# Patient Record
Sex: Male | Born: 1945 | Race: White | Hispanic: No | Marital: Married | Smoking: Never smoker
Health system: Southern US, Community
[De-identification: ages and names within clinical notes are randomized; demographics above are authoritative.]

## PROBLEM LIST (undated history)

## (undated) DIAGNOSIS — E7800 Pure hypercholesterolemia, unspecified: Secondary | ICD-10-CM

## (undated) DIAGNOSIS — F0390 Unspecified dementia without behavioral disturbance: Secondary | ICD-10-CM

## (undated) DIAGNOSIS — K5792 Diverticulitis of intestine, part unspecified, without perforation or abscess without bleeding: Secondary | ICD-10-CM

## (undated) DIAGNOSIS — E079 Disorder of thyroid, unspecified: Secondary | ICD-10-CM

## (undated) DIAGNOSIS — M810 Age-related osteoporosis without current pathological fracture: Secondary | ICD-10-CM

## (undated) DIAGNOSIS — I10 Essential (primary) hypertension: Secondary | ICD-10-CM

## (undated) HISTORY — PX: TRANSURETHRAL RESECTION OF PROSTATE: SHX73

## (undated) HISTORY — PX: NOSE SURGERY: SHX723

---

## 2011-12-22 ENCOUNTER — Emergency Department (HOSPITAL_BASED_OUTPATIENT_CLINIC_OR_DEPARTMENT_OTHER)
Admission: EM | Admit: 2011-12-22 | Discharge: 2011-12-22 | Disposition: A | Discharge status: Home / Self Care | Payer: Medicare Other | Attending: Emergency Medicine | Admitting: Emergency Medicine

## 2011-12-22 ENCOUNTER — Encounter (HOSPITAL_BASED_OUTPATIENT_CLINIC_OR_DEPARTMENT_OTHER): Payer: Self-pay | Admitting: *Deleted

## 2011-12-22 ENCOUNTER — Emergency Department (INDEPENDENT_AMBULATORY_CARE_PROVIDER_SITE_OTHER): Payer: Medicare Other

## 2011-12-22 DIAGNOSIS — R509 Fever, unspecified: Secondary | ICD-10-CM | POA: Insufficient documentation

## 2011-12-22 DIAGNOSIS — R05 Cough: Secondary | ICD-10-CM

## 2011-12-22 DIAGNOSIS — I10 Essential (primary) hypertension: Secondary | ICD-10-CM | POA: Insufficient documentation

## 2011-12-22 DIAGNOSIS — R059 Cough, unspecified: Secondary | ICD-10-CM

## 2011-12-22 DIAGNOSIS — Z79899 Other long term (current) drug therapy: Secondary | ICD-10-CM | POA: Insufficient documentation

## 2011-12-22 DIAGNOSIS — R197 Diarrhea, unspecified: Secondary | ICD-10-CM

## 2011-12-22 DIAGNOSIS — Z7982 Long term (current) use of aspirin: Secondary | ICD-10-CM | POA: Insufficient documentation

## 2011-12-22 DIAGNOSIS — R630 Anorexia: Secondary | ICD-10-CM | POA: Insufficient documentation

## 2011-12-22 DIAGNOSIS — K5792 Diverticulitis of intestine, part unspecified, without perforation or abscess without bleeding: Secondary | ICD-10-CM

## 2011-12-22 DIAGNOSIS — R1032 Left lower quadrant pain: Secondary | ICD-10-CM | POA: Insufficient documentation

## 2011-12-22 DIAGNOSIS — J069 Acute upper respiratory infection, unspecified: Secondary | ICD-10-CM

## 2011-12-22 DIAGNOSIS — K5732 Diverticulitis of large intestine without perforation or abscess without bleeding: Secondary | ICD-10-CM | POA: Insufficient documentation

## 2011-12-22 DIAGNOSIS — R1110 Vomiting, unspecified: Secondary | ICD-10-CM

## 2011-12-22 DIAGNOSIS — E7800 Pure hypercholesterolemia, unspecified: Secondary | ICD-10-CM | POA: Insufficient documentation

## 2011-12-22 DIAGNOSIS — R112 Nausea with vomiting, unspecified: Secondary | ICD-10-CM | POA: Insufficient documentation

## 2011-12-22 HISTORY — DX: Disorder of thyroid, unspecified: E07.9

## 2011-12-22 HISTORY — DX: Pure hypercholesterolemia, unspecified: E78.00

## 2011-12-22 HISTORY — DX: Essential (primary) hypertension: I10

## 2011-12-22 LAB — COMPREHENSIVE METABOLIC PANEL
ALT: 27 U/L (ref 0–53)
AST: 34 U/L (ref 0–37)
Albumin: 4.1 g/dL (ref 3.5–5.2)
Alkaline Phosphatase: 43 U/L (ref 39–117)
BUN: 10 mg/dL (ref 6–23)
CO2: 23 mEq/L (ref 19–32)
Calcium: 9.2 mg/dL (ref 8.4–10.5)
Chloride: 101 mEq/L (ref 96–112)
Creatinine, Ser: 1 mg/dL (ref 0.50–1.35)
GFR calc Af Amer: 89 mL/min — ABNORMAL LOW (ref >90)
GFR calc non Af Amer: 77 mL/min — ABNORMAL LOW (ref >90)
Glucose, Bld: 100 mg/dL — ABNORMAL HIGH (ref 70–99)
Potassium: 3.3 mEq/L — ABNORMAL LOW (ref 3.5–5.1)
Sodium: 138 mEq/L (ref 135–145)
Total Bilirubin: 0.3 mg/dL (ref 0.3–1.2)
Total Protein: 7.6 g/dL (ref 6.0–8.3)

## 2011-12-22 LAB — DIFFERENTIAL
Basophils Absolute: 0 10*3/uL (ref 0.0–0.1)
Basophils Relative: 0 % (ref 0–1)
Eosinophils Absolute: 0 10*3/uL (ref 0.0–0.7)
Eosinophils Relative: 0 % (ref 0–5)
Lymphocytes Relative: 7 % — ABNORMAL LOW (ref 12–46)
Lymphs Abs: 0.9 10*3/uL (ref 0.7–4.0)
Monocytes Absolute: 0.7 10*3/uL (ref 0.1–1.0)
Monocytes Relative: 6 % (ref 3–12)
Neutro Abs: 10.1 10*3/uL — ABNORMAL HIGH (ref 1.7–7.7)
Neutrophils Relative %: 86 % — ABNORMAL HIGH (ref 43–77)

## 2011-12-22 LAB — CBC
HCT: 42.3 % (ref 39.0–52.0)
Hemoglobin: 14.7 g/dL (ref 13.0–17.0)
MCH: 29.9 pg (ref 26.0–34.0)
MCHC: 34.8 g/dL (ref 30.0–36.0)
MCV: 86.2 fL (ref 78.0–100.0)
Platelets: 237 10*3/uL (ref 150–400)
RBC: 4.91 MIL/uL (ref 4.22–5.81)
RDW: 13.8 % (ref 11.5–15.5)
WBC: 11.7 10*3/uL — ABNORMAL HIGH (ref 4.0–10.5)

## 2011-12-22 LAB — LIPASE, BLOOD: Lipase: 23 U/L (ref 11–59)

## 2011-12-22 MED ORDER — ONDANSETRON HCL 4 MG/2ML IJ SOLN
4.0000 mg | Freq: Once | INTRAMUSCULAR | Status: AC
  Administered 2011-12-22: 4 mg via INTRAVENOUS
  Filled 2011-12-22: qty 2

## 2011-12-22 MED ORDER — ONDANSETRON HCL 8 MG PO TABS: 8.0000 mg | ORAL_TABLET | Freq: Three times a day (TID) | ORAL | Status: AC | PRN

## 2011-12-22 MED ORDER — SODIUM CHLORIDE 0.9 % IV SOLN: INTRAVENOUS | Status: DC

## 2011-12-22 MED ORDER — HYDROCODONE-ACETAMINOPHEN 5-325 MG PO TABS: 2.0000 | ORAL_TABLET | ORAL | Status: AC | PRN

## 2011-12-22 MED ORDER — METRONIDAZOLE 500 MG PO TABS
500.0000 mg | ORAL_TABLET | Freq: Once | ORAL | Status: AC
  Administered 2011-12-22: 500 mg via ORAL
  Filled 2011-12-22: qty 1

## 2011-12-22 MED ORDER — CIPROFLOXACIN HCL 500 MG PO TABS: 500.0000 mg | ORAL_TABLET | Freq: Two times a day (BID) | ORAL | Status: AC

## 2011-12-22 MED ORDER — FAMOTIDINE IN NACL 20-0.9 MG/50ML-% IV SOLN
20.0000 mg | Freq: Once | INTRAVENOUS | Status: AC
  Administered 2011-12-22: 20 mg via INTRAVENOUS
  Filled 2011-12-22: qty 50

## 2011-12-22 MED ORDER — CIPROFLOXACIN HCL 500 MG PO TABS
500.0000 mg | ORAL_TABLET | Freq: Once | ORAL | Status: AC
  Administered 2011-12-22: 500 mg via ORAL
  Filled 2011-12-22: qty 1

## 2011-12-22 MED ORDER — HYDROMORPHONE HCL PF 1 MG/ML IJ SOLN
1.0000 mg | Freq: Once | INTRAMUSCULAR | Status: AC
  Administered 2011-12-22: 1 mg via INTRAVENOUS
  Filled 2011-12-22: qty 1

## 2011-12-22 MED ORDER — METRONIDAZOLE 500 MG PO TABS: 500.0000 mg | ORAL_TABLET | Freq: Three times a day (TID) | ORAL | Status: AC

## 2011-12-22 MED ORDER — SODIUM CHLORIDE 0.9 % IV BOLUS (SEPSIS)
1000.0000 mL | Freq: Once | INTRAVENOUS | Status: AC
  Administered 2011-12-22: 1000 mL via INTRAVENOUS

## 2011-12-22 NOTE — ED Notes (Signed)
Pt states he has had diarrhea x 3 days. Cold s/s last week. Wed/Thur vomiting. Diarrhea off and on since.

## 2011-12-22 NOTE — ED Provider Notes (Signed)
History   This chart was scribed for Felisa Bonier, MD by Melba Coon. The patient was seen in room MH05/MH05 and the patient's care was started at 6:47PM.    CSN: [id number]  Arrival date & time 12/22/11  1732   First MD Initiated Contact with Patient 12/22/11 1823      Chief Complaint  Patient presents with  . Diarrhea    (Consider location/radiation/quality/duration/timing/severity/associated sxs/prior treatment) HPI Steve [name] is a 66 y.o. male who presents to the Emergency Department complaining of intermittent, moderate to severe diarrhea wth an onset 3 days ago. Pt also had cold-like symptoms such as fever, productive cough, and chills a week ago. Nausea is present and emesis started 4 days ago and ended 3 days ago. Pt has had more than 5 bowel movements/day. Stools were loose and watery on 3 days ago. Pt only started to have abdominal cramps 2 days ago. Pt has taken Pepto-Bismol and imodium which has slowed down diarrhea. Fluid intake has been good but aggravates his cramps. No abx have been taken. Decreased appetite present. No HA, CP, or extremity pain. Pt has a Hx of HTN, hypercholesteremia, and thyroid disease. No Hx of CHF. Allergic to penicillins. No other pertinent medical problems.  Past Medical History  Diagnosis Date  . Hypertension   . Hypercholesteremia   . Thyroid disease     Past Surgical History  Procedure Date  . Nose surgery     History reviewed. No pertinent family history.  History  Substance Use Topics  . Smoking status: Never Smoker   . Smokeless tobacco: Not on file  . Alcohol Use: No      Review of Systems 10 Systems reviewed and are negative for acute change except as noted in the HPI.  Allergies  Penicillins  Home Medications   Current Outpatient Rx  Name Route Sig Dispense Refill  . ASPIRIN 325 MG PO TBEC Oral Take 325 mg by mouth daily.    Marland Kitchen BISMUTH SUBSALICYLATE 262 MG PO CHEW Oral Chew 524 mg by mouth as needed. For  diarrhea    . VITAMIN D 1000 UNITS PO TABS Oral Take 1,000 Units by mouth daily.    . CYANOCOBALAMIN 100 MCG PO TABS Oral Take 100 mcg by mouth daily.    Marland Kitchen ESOMEPRAZOLE MAGNESIUM 40 MG PO CPDR Oral Take 40 mg by mouth daily before breakfast.    . FENOFIBRATE PO Oral Take 1 tablet by mouth daily.    . OMEGA-3 FATTY ACIDS 1000 MG PO CAPS Oral Take 1-2 g by mouth 2 (two) times daily. Take 1 capsule in the morning and 2 capsules in the evening    . LEVOTHYROXINE SODIUM 100 MCG PO TABS Oral Take 100 mcg by mouth daily.    Marland Kitchen LISINOPRIL 10 MG PO TABS Oral Take 10 mg by mouth daily.    Marland Kitchen LOPERAMIDE HCL 2 MG PO CAPS Oral Take 2 mg by mouth 4 (four) times daily as needed. For diarrhea    . MONTELUKAST SODIUM 10 MG PO TABS Oral Take 10 mg by mouth at bedtime.    Marland Kitchen NIACIN ER (ANTIHYPERLIPIDEMIC) 1000 MG PO TBCR Oral Take 2,000 mg by mouth at bedtime.    Marland Kitchen SIMVASTATIN 40 MG PO TABS Oral Take 40 mg by mouth every evening.    Marland Kitchen TAMSULOSIN HCL 0.4 MG PO CAPS Oral Take 0.4 mg by mouth daily.    Marland Kitchen VITAMIN C 500 MG PO TABS Oral Take 500 mg by mouth daily.  BP 113/99  Pulse 78  Temp(Src) 97.7 F (36.5 C) (Oral)  Resp 20  Ht 6\' 3"  (1.905 m)  Wt 282 lb (127.914 kg)  BMI 35.25 kg/m2  SpO2 97%  Physical Exam  Nursing note and vitals reviewed. Constitutional: He appears well-developed and well-nourished.       Awake, alert, nontoxic appearance.  HENT:  Head: Normocephalic and atraumatic.  Mouth/Throat: Oropharynx is clear and moist. No oropharyngeal exudate.  Eyes: Conjunctivae and EOM are normal. Pupils are equal, round, and reactive to light. Right eye exhibits no discharge. Left eye exhibits no discharge. No scleral icterus.  Neck: Normal range of motion. Neck supple.  Cardiovascular: Normal rate, regular rhythm and normal heart sounds.  Exam reveals no gallop and no friction rub.   No murmur heard. Pulmonary/Chest: Effort normal and breath sounds normal. No respiratory distress. He has no  wheezes. He has no rales. He exhibits no tenderness.  Abdominal: Soft. Bowel sounds are normal. He exhibits no distension. There is tenderness (Mild LLQ tenderness). There is no rebound and no guarding.  Musculoskeletal: He exhibits no tenderness.       Baseline ROM, no obvious new focal weakness.  Neurological:       Mental status and motor strength appears baseline for patient and situation.  Skin: Skin is warm and dry. No rash noted.       Nml perfusion.  Psychiatric: He has a normal mood and affect. His behavior is normal.    ED Course  Procedures (including critical care time)  DIAGNOSTIC STUDIES: Oxygen Saturation is 97% on room air, normal by my interpretation.    COORDINATION OF CARE:  6:58PM - EDMD will order CXR to exclude pneumonia and IV fluids.  Results for orders placed during the hospital encounter of 12/22/11  CBC      Component Value Range   WBC 11.7 (*) 4.0 - 10.5 (K/uL)   RBC 4.91  4.22 - 5.81 (MIL/uL)   Hemoglobin 14.7  13.0 - 17.0 (g/dL)   HCT 16.1  09.6 - 04.5 (%)   MCV 86.2  78.0 - 100.0 (fL)   MCH 29.9  26.0 - 34.0 (pg)   MCHC 34.8  30.0 - 36.0 (g/dL)   RDW 40.9  81.1 - 91.4 (%)   Platelets 237  150 - 400 (K/uL)  DIFFERENTIAL      Component Value Range   Neutrophils Relative 86 (*) 43 - 77 (%)   Neutro Abs 10.1 (*) 1.7 - 7.7 (K/uL)   Lymphocytes Relative 7 (*) 12 - 46 (%)   Lymphs Abs 0.9  0.7 - 4.0 (K/uL)   Monocytes Relative 6  3 - 12 (%)   Monocytes Absolute 0.7  0.1 - 1.0 (K/uL)   Eosinophils Relative 0  0 - 5 (%)   Eosinophils Absolute 0.0  0.0 - 0.7 (K/uL)   Basophils Relative 0  0 - 1 (%)   Basophils Absolute 0.0  0.0 - 0.1 (K/uL)  COMPREHENSIVE METABOLIC PANEL      Component Value Range   Sodium 138  135 - 145 (mEq/L)   Potassium 3.3 (*) 3.5 - 5.1 (mEq/L)   Chloride 101  96 - 112 (mEq/L)   CO2 23  19 - 32 (mEq/L)   Glucose, Bld 100 (*) 70 - 99 (mg/dL)   BUN 10  6 - 23 (mg/dL)   Creatinine, Ser 7.82  0.50 - 1.35 (mg/dL)   Calcium  9.2  8.4 - 10.5 (mg/dL)   Total Protein 7.6  6.0 - 8.3 (g/dL)   Albumin 4.1  3.5 - 5.2 (g/dL)   AST 34  0 - 37 (U/L)   ALT 27  0 - 53 (U/L)   Alkaline Phosphatase 43  39 - 117 (U/L)   Total Bilirubin 0.3  0.3 - 1.2 (mg/dL)   GFR calc non Af Amer 77 (*) >90 (mL/min)   GFR calc Af Amer 89 (*) >90 (mL/min)  LIPASE, BLOOD      Component Value Range   Lipase 23  11 - 59 (U/L)     Dg Chest 2 View  12/22/2011  *RADIOLOGY REPORT*  Clinical Data: Cough with nausea, vomiting and diarrhea.  CHEST - 2 VIEW  Comparison: None.  Findings: The heart size is at the upper limits of normal.  Density projecting over the lower thoracic spine on the lateral view most likely represents tortuosity of the descending thoracic aorta.  The lungs are clear.  There is no pleural effusion.  There is a lower thoracic compression deformity which is age indeterminate.  IMPRESSION:  1.  No acute cardiopulmonary process identified. 2.  Aortic tortuosity is felt to account for density over the lower thoracic spine on the lateral view. 3.  Age indeterminate lower thoracic compression deformity. Per CMS PQRS reporting requirements (PQRS Measure 24): Given the patient's age of greater than 50 and the fracture site (hip, distal radius, or spine), the patient should be tested for osteoporosis using DXA, and the appropriate treatment considered based on the DXA results.  Original Report Authenticated By: Gerrianne Scale, M.D.   Chest x-ray reviewed, no apparent pneumonia.  No diagnosis found.    MDM   Differential Diagnosis: viral upper respiratory infection vs pneumonia vs gastroenteritis with persistent diarrhea vs diverticulitis. EDMD does not suspect pericolonic abscess based on PE and does not feel CT scan is necessary to further evaluate pt. EDMD will treat for diverticulitis and advise f/u with PCP.  I personally performed the services described in this documentation, which was scribed in my presence. The recorded  information has been reviewed and considered.      Felisa Bonier, MD 12/22/11 2035

## 2011-12-22 NOTE — ED Notes (Signed)
Patient transported to X-ray 

## 2014-03-30 ENCOUNTER — Emergency Department (HOSPITAL_BASED_OUTPATIENT_CLINIC_OR_DEPARTMENT_OTHER): Payer: Medicare Other

## 2014-03-30 ENCOUNTER — Encounter (HOSPITAL_BASED_OUTPATIENT_CLINIC_OR_DEPARTMENT_OTHER): Payer: Self-pay | Admitting: Emergency Medicine

## 2014-03-30 ENCOUNTER — Emergency Department (HOSPITAL_BASED_OUTPATIENT_CLINIC_OR_DEPARTMENT_OTHER)
Admission: EM | Admit: 2014-03-30 | Discharge: 2014-03-30 | Disposition: A | Discharge status: Home / Self Care | Payer: Medicare Other | Attending: Emergency Medicine | Admitting: Emergency Medicine

## 2014-03-30 DIAGNOSIS — Z79899 Other long term (current) drug therapy: Secondary | ICD-10-CM | POA: Insufficient documentation

## 2014-03-30 DIAGNOSIS — Z880 Allergy status to penicillin: Secondary | ICD-10-CM | POA: Insufficient documentation

## 2014-03-30 DIAGNOSIS — R0602 Shortness of breath: Secondary | ICD-10-CM | POA: Insufficient documentation

## 2014-03-30 DIAGNOSIS — E7800 Pure hypercholesterolemia, unspecified: Secondary | ICD-10-CM | POA: Insufficient documentation

## 2014-03-30 DIAGNOSIS — I10 Essential (primary) hypertension: Secondary | ICD-10-CM | POA: Insufficient documentation

## 2014-03-30 DIAGNOSIS — J069 Acute upper respiratory infection, unspecified: Secondary | ICD-10-CM

## 2014-03-30 DIAGNOSIS — Z7982 Long term (current) use of aspirin: Secondary | ICD-10-CM | POA: Insufficient documentation

## 2014-03-30 DIAGNOSIS — E079 Disorder of thyroid, unspecified: Secondary | ICD-10-CM | POA: Insufficient documentation

## 2014-03-30 LAB — BASIC METABOLIC PANEL
BUN: 15 mg/dL (ref 6–23)
CO2: 22 mEq/L (ref 19–32)
Calcium: 8.8 mg/dL (ref 8.4–10.5)
Chloride: 106 mEq/L (ref 96–112)
Creatinine, Ser: 1 mg/dL (ref 0.50–1.35)
GFR calc Af Amer: 88 mL/min — ABNORMAL LOW (ref >90)
GFR calc non Af Amer: 76 mL/min — ABNORMAL LOW (ref >90)
Glucose, Bld: 100 mg/dL — ABNORMAL HIGH (ref 70–99)
Potassium: 4 mEq/L (ref 3.7–5.3)
Sodium: 140 mEq/L (ref 137–147)

## 2014-03-30 LAB — CBC
HCT: 40.4 % (ref 39.0–52.0)
Hemoglobin: 14.1 g/dL (ref 13.0–17.0)
MCH: 31.3 pg (ref 26.0–34.0)
MCHC: 34.9 g/dL (ref 30.0–36.0)
MCV: 89.8 fL (ref 78.0–100.0)
Platelets: 206 10*3/uL (ref 150–400)
RBC: 4.5 MIL/uL (ref 4.22–5.81)
RDW: 14.2 % (ref 11.5–15.5)
WBC: 7.7 10*3/uL (ref 4.0–10.5)

## 2014-03-30 LAB — URINALYSIS, ROUTINE W REFLEX MICROSCOPIC
Bilirubin Urine: NEGATIVE
Glucose, UA: NEGATIVE mg/dL
Hgb urine dipstick: NEGATIVE
Ketones, ur: NEGATIVE mg/dL
Leukocytes, UA: NEGATIVE
Nitrite: NEGATIVE
Protein, ur: NEGATIVE mg/dL
Specific Gravity, Urine: 1.019 (ref 1.005–1.030)
Urobilinogen, UA: 0.2 mg/dL (ref 0.0–1.0)
pH: 6 (ref 5.0–8.0)

## 2014-03-30 LAB — PRO B NATRIURETIC PEPTIDE: Pro B Natriuretic peptide (BNP): 61.4 pg/mL (ref 0–125)

## 2014-03-30 LAB — TROPONIN I: Troponin I: <0.3 ng/mL (ref <0.30)

## 2014-03-30 MED ORDER — NITROGLYCERIN 0.4 MG SL SUBL
0.4000 mg | SUBLINGUAL_TABLET | SUBLINGUAL | Status: DC | PRN
  Administered 2014-03-30: 0.4 mg via SUBLINGUAL
  Filled 2014-03-30: qty 1

## 2014-03-30 MED ORDER — ASPIRIN 325 MG PO TABS
325.0000 mg | ORAL_TABLET | Freq: Once | ORAL | Status: AC
  Administered 2014-03-30: 325 mg via ORAL
  Filled 2014-03-30: qty 1

## 2014-03-30 NOTE — Discharge Instructions (Signed)

## 2014-03-30 NOTE — ED Provider Notes (Signed)
CSN: [id number]     Arrival date & time 03/30/14  1728 History   First MD Initiated Contact with Patient 03/30/14 1741     Chief Complaint  Patient presents with  . URI     (Consider location/radiation/quality/duration/timing/severity/associated sxs/prior Treatment) Patient is a 68 y.o. male presenting with URI and chest pain. The history is provided by the patient.  URI Presenting symptoms: no cough and no fever   Severity:  Mild Chest Pain Pain location:  Substernal area Pain quality: pressure   Pain radiates to:  Does not radiate Pain radiates to the back: no   Pain severity:  Mild Onset quality:  Gradual Duration:  2 days Timing:  Intermittent Progression:  Worsening Chronicity:  New Context: at rest   Relieved by:  Nothing Worsened by:  Nothing tried Associated symptoms: shortness of breath   Associated symptoms: no cough and no fever     Past Medical History  Diagnosis Date  . Hypertension   . Hypercholesteremia   . Thyroid disease    Past Surgical History  Procedure Laterality Date  . Nose surgery     History reviewed. No pertinent family history. History  Substance Use Topics  . Smoking status: Never Smoker   . Smokeless tobacco: Not on file  . Alcohol Use: No    Review of Systems  Constitutional: Negative for fever.  Respiratory: Positive for shortness of breath. Negative for cough.   Cardiovascular: Positive for chest pain (chest heaviness).  All other systems reviewed and are negative.     Allergies  Penicillins  Home Medications   Prior to Admission medications   Medication Sig Start Date End Date Taking? Authorizing Provider  aspirin 325 MG EC tablet Take 325 mg by mouth daily.    Historical Provider, MD  bismuth subsalicylate (PEPTO BISMOL) 262 MG chewable tablet Chew 524 mg by mouth as needed. For diarrhea    Historical Provider, MD  cholecalciferol (VITAMIN D) 1000 UNITS tablet Take 1,000 Units by mouth daily.    Historical Provider,  MD  cyanocobalamin 100 MCG tablet Take 100 mcg by mouth daily.    Historical Provider, MD  esomeprazole (NEXIUM) 40 MG capsule Take 40 mg by mouth daily before breakfast.    Historical Provider, MD  FENOFIBRATE PO Take 1 tablet by mouth daily.    Historical Provider, MD  fish oil-omega-3 fatty acids 1000 MG capsule Take 1-2 g by mouth 2 (two) times daily. Take 1 capsule in the morning and 2 capsules in the evening    Historical Provider, MD  levothyroxine (SYNTHROID, LEVOTHROID) 100 MCG tablet Take 100 mcg by mouth daily.    Historical Provider, MD  lisinopril (PRINIVIL,ZESTRIL) 10 MG tablet Take 10 mg by mouth daily.    Historical Provider, MD  loperamide (IMODIUM) 2 MG capsule Take 2 mg by mouth 4 (four) times daily as needed. For diarrhea    Historical Provider, MD  montelukast (SINGULAIR) 10 MG tablet Take 10 mg by mouth at bedtime.    Historical Provider, MD  niacin (NIASPAN) 1000 MG CR tablet Take 2,000 mg by mouth at bedtime.    Historical Provider, MD  simvastatin (ZOCOR) 40 MG tablet Take 40 mg by mouth every evening.    Historical Provider, MD  Tamsulosin HCl (FLOMAX) 0.4 MG CAPS Take 0.4 mg by mouth daily.    Historical Provider, MD  vitamin C (ASCORBIC ACID) 500 MG tablet Take 500 mg by mouth daily.    Historical Provider, MD   BP  107/67  Pulse 70  Temp(Src) 98.7 F (37.1 C) (Oral)  Resp 22  SpO2 94% Physical Exam  Constitutional: He is oriented to person, place, and time. He appears well-developed and well-nourished. No distress.  HENT:  Head: Normocephalic and atraumatic.  Right Ear: Tympanic membrane normal.  Left Ear: Tympanic membrane normal.  Mouth/Throat: Oropharynx is clear and moist. No oropharyngeal exudate.  Eyes: EOM are normal. Pupils are equal, round, and reactive to light.  Neck: Normal range of motion. Neck supple.  Cardiovascular: Normal rate and regular rhythm.  Exam reveals no friction rub.   No murmur heard. Pulmonary/Chest: Effort normal and breath  sounds normal. No respiratory distress. He has no wheezes. He has no rales.  Abdominal: He exhibits no distension. There is no tenderness. There is no rebound.  Musculoskeletal: Normal range of motion. He exhibits no edema.  Neurological: He is alert and oriented to person, place, and time.  Skin: He is not diaphoretic.    ED Course  Procedures (including critical care time) Labs Review Labs Reviewed  CBC  BASIC METABOLIC PANEL  TROPONIN I  URINALYSIS, ROUTINE W REFLEX MICROSCOPIC  PRO B NATRIURETIC PEPTIDE    Imaging Review No results found.   EKG Interpretation   Date/Time:  Wednesday March 30 2014 18:13:29 EDT Ventricular Rate:  58 PR Interval:  166 QRS Duration: 92 QT Interval:  422 QTC Calculation: 414 R Axis:   64 Text Interpretation:  Sinus bradycardia Otherwise normal ECG No prior  Confirmed by Gwendolyn GrantWALDEN  MD, BLAIR ([id number]) on 03/30/2014 6:17:35 PM      MDM   Final diagnoses:  URI (upper respiratory infection)    68 year old male here with chest heaviness. He states he's had multiple rounds of antibiotics for upper respiratory infections, sinus infections through his doctors [facility]. He states 2 days ago he noticed intermittent chest heaviness. He is very fatigued with normal activities. He denies any radiation of the chest pressure. No alleviation or exacerbating factors. No prior heart history. Here states mild chest heaviness roughly 2-3/10. On exam, normal TMs, normal oropharynx, no sinus tenderness on exam. Lungs clear. Belly benign. Denies any cough, wheezing.  EKG ok. Labs ok. CXR clear. When speaking more with him, he states his CP was relieved with moving through the department and getting fresh air. No major change with NTG. Patient very vague about his symptoms, stating he feels, "clogged up in head and chest." He states many times he can't be sure how to describe it. With his month long history of multiple URIs and  antibiotics, normal cardiac workup, doubt this is cardiac. Given strict return precautions to come back if the chest heaviness persists and doesn't improve. Instructed to use decongestants.  Instructed to f/u with his PCP.   Dagmar HaitWilliam Blair Walden, MD 03/30/14 [phone]

## 2014-03-30 NOTE — ED Notes (Signed)
Pt c/o URI symptoms x 2 months recently finished 2nd round of ABx and pred dose pk

## 2014-03-30 NOTE — ED Notes (Signed)
Pt sts "stuffy" feeling in chest has decreased. 2nd and 3rd nitro held due to drop in BP. Dr. Gwendolyn Grant made aware.

## 2014-03-30 NOTE — ED Notes (Signed)
MD at bedside. 

## 2014-07-29 DIAGNOSIS — IMO0001 Reserved for inherently not codable concepts without codable children: Secondary | ICD-10-CM | POA: Insufficient documentation

## 2015-10-16 DIAGNOSIS — I10 Essential (primary) hypertension: Secondary | ICD-10-CM | POA: Insufficient documentation

## 2015-10-16 DIAGNOSIS — E785 Hyperlipidemia, unspecified: Secondary | ICD-10-CM | POA: Insufficient documentation

## 2015-10-16 DIAGNOSIS — K5730 Diverticulosis of large intestine without perforation or abscess without bleeding: Secondary | ICD-10-CM | POA: Insufficient documentation

## 2015-10-16 DIAGNOSIS — E039 Hypothyroidism, unspecified: Secondary | ICD-10-CM | POA: Insufficient documentation

## 2015-10-16 DIAGNOSIS — G4733 Obstructive sleep apnea (adult) (pediatric): Secondary | ICD-10-CM | POA: Insufficient documentation

## 2015-10-16 DIAGNOSIS — K219 Gastro-esophageal reflux disease without esophagitis: Secondary | ICD-10-CM | POA: Insufficient documentation

## 2015-10-17 DIAGNOSIS — R454 Irritability and anger: Secondary | ICD-10-CM | POA: Insufficient documentation

## 2015-10-17 DIAGNOSIS — M810 Age-related osteoporosis without current pathological fracture: Secondary | ICD-10-CM | POA: Insufficient documentation

## 2016-01-17 DIAGNOSIS — N400 Enlarged prostate without lower urinary tract symptoms: Secondary | ICD-10-CM | POA: Insufficient documentation

## 2016-01-22 DIAGNOSIS — G3184 Mild cognitive impairment, so stated: Secondary | ICD-10-CM | POA: Insufficient documentation

## 2016-03-22 DIAGNOSIS — Z0000 Encounter for general adult medical examination without abnormal findings: Secondary | ICD-10-CM | POA: Insufficient documentation

## 2016-03-22 DIAGNOSIS — Z125 Encounter for screening for malignant neoplasm of prostate: Secondary | ICD-10-CM | POA: Insufficient documentation

## 2016-05-21 DIAGNOSIS — J310 Chronic rhinitis: Secondary | ICD-10-CM | POA: Insufficient documentation

## 2016-05-21 DIAGNOSIS — H903 Sensorineural hearing loss, bilateral: Secondary | ICD-10-CM | POA: Insufficient documentation

## 2016-06-26 DIAGNOSIS — Z9181 History of falling: Secondary | ICD-10-CM | POA: Insufficient documentation

## 2017-03-21 DIAGNOSIS — H2510 Age-related nuclear cataract, unspecified eye: Secondary | ICD-10-CM | POA: Insufficient documentation

## 2018-02-16 DIAGNOSIS — R413 Other amnesia: Secondary | ICD-10-CM | POA: Insufficient documentation

## 2018-02-16 DIAGNOSIS — F0150 Vascular dementia without behavioral disturbance: Secondary | ICD-10-CM | POA: Insufficient documentation

## 2018-02-16 DIAGNOSIS — G609 Hereditary and idiopathic neuropathy, unspecified: Secondary | ICD-10-CM | POA: Insufficient documentation

## 2018-02-16 DIAGNOSIS — R269 Unspecified abnormalities of gait and mobility: Secondary | ICD-10-CM | POA: Insufficient documentation

## 2018-04-20 ENCOUNTER — Encounter: Payer: Self-pay | Admitting: Physical Therapy

## 2018-04-20 ENCOUNTER — Ambulatory Visit: Payer: Medicare Other | Attending: Student | Admitting: Physical Therapy

## 2018-04-20 ENCOUNTER — Other Ambulatory Visit: Payer: Self-pay

## 2018-04-20 DIAGNOSIS — M79672 Pain in left foot: Secondary | ICD-10-CM | POA: Diagnosis present

## 2018-04-20 DIAGNOSIS — M25672 Stiffness of left ankle, not elsewhere classified: Secondary | ICD-10-CM | POA: Diagnosis present

## 2018-04-20 DIAGNOSIS — R29898 Other symptoms and signs involving the musculoskeletal system: Secondary | ICD-10-CM | POA: Insufficient documentation

## 2018-04-20 DIAGNOSIS — R262 Difficulty in walking, not elsewhere classified: Secondary | ICD-10-CM | POA: Diagnosis present

## 2018-04-20 DIAGNOSIS — M6281 Muscle weakness (generalized): Secondary | ICD-10-CM | POA: Insufficient documentation

## 2018-04-20 NOTE — Therapy (Signed)
[facility] [address]  [address] [place], [place], [address] Phone: [phone]   Fax:  [phone]  Physical Therapy Evaluation  Patient Details  Name: Steve [name] MRN: [id number] Date of Birth: 29-Jul-1946 Referring Provider: Alfredo Martinez, PA-C   Encounter Date: 04/20/2018  PT End of Session - 04/20/18 1156    Visit Number  1    Number of Visits  13    Date for PT Re-Evaluation  06/01/18    Authorization Type  [employer] Medicare     PT Start Time  1101    PT Stop Time  1148    PT Time Calculation (min)  47 min    Activity Tolerance  Patient tolerated treatment well    Behavior During Therapy  [facility] for tasks assessed/performed       Past Medical History:  Diagnosis Date  . Hypercholesteremia   . Hypertension   . Thyroid disease     Past Surgical History:  Procedure Laterality Date  . NOSE SURGERY      There were no vitals filed for this visit.   Subjective Assessment - 04/20/18 1104    Subjective  Patient reports 1 year ago hi L ankle on lawnmower. Has gotten worse overall since then with intermittent bouts of remitting pain. 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 Pain worse when doing yard work, when walking, and sitting with ankle everted. Pain starts in L medial malleolus region and radiates to medial arch. Has been wearing ankle brace which has helped but hasn't worn it in 2-3 days. Denies N/T in medial arch but intermittent N/T in L big toe.    Pertinent History  GERD, HTN, osteoporosis    Limitations  Walking;House hold activities    How long can you sit comfortably?  unlimited     How long can you stand comfortably?  not limited by pain, limited by fatigue    How long can you walk comfortably?  ~30 minutes     Diagnostic tests  per patient- MD did xray of L foot which showed "ligament fatigued and fallen arch"    Patient Stated Goals  get the L foot well    Currently in Pain?  No/denies    Pain Score   0-No pain    Pain Location  Foot    Pain Orientation  Left    Pain Descriptors / Indicators  Shooting;Sharp    Pain Type  Chronic pain    Pain Radiating Towards  medial arch    Aggravating Factors   prolonged walking, working in the yard, everted ankle    Pain Relieving Factors  rest, cream for arthritis         [facility] PT Assessment - 04/20/18 1116      Assessment   Medical Diagnosis  Dysfunction of posterior tibial tendon (L)    Referring Provider  Alfredo Martinez, PA-C    Onset Date/Surgical Date  04/20/17    Next MD Visit  -- reported to come back in 2 months    Prior Therapy  No      Precautions   Precautions  None osteoporosis, aspirin as blood thinner, HTN      Restrictions   Weight Bearing Restrictions  No      Balance Screen   Has the patient fallen in the past 6 months  No    Has the patient had a decrease in activity level because of a fear of falling?  No    Is the patient reluctant to leave their home because of a fear of falling?   No      Home Public house managernvironment   Living Environment  Private residence    Living Arrangements  Spouse/significant other    Available Help at Discharge  Family    Type of Home  House    Home Access  Stairs to enter    Entrance Stairs-Number of Steps  6    Entrance Stairs-Rails  Right;Left    Home Layout  One level    Home Equipment  [place] - single point      Prior Function   Level of Independence  Independent    Vocation  Retired    Leisure  none       Cognition   Overall Cognitive Status  Within Functional Limits for tasks assessed per-family patient has dementia      Observation/Other Assessments   Focus on Therapeutic Outcomes (FOTO)   NT- next session      Sensation   Light Touch  Appears Intact      Coordination   Gross Motor Movements are Fluid and Coordinated  Yes      Posture/Postural Control   Posture/Postural Control  Postural limitations    Postural Limitations  Rounded Shoulders;Forward head;Posterior pelvic  tilt;Weight shift right      ROM / Strength   AROM / PROM / Strength  AROM;PROM;Strength      AROM   AROM Assessment Site  Ankle    Right/Left Ankle  Right;Left    Right Ankle Dorsiflexion  8 5-6/10 pain in L medila arch    Right Ankle Plantar Flexion  40 5-6/10 pain in L medial arch    Right Ankle Inversion  22 2-3/10 pain    Right Ankle Eversion  5 2-3/10 pain    Left Ankle Dorsiflexion  5    Left Ankle Plantar Flexion  45    Left Ankle Inversion  34    Left Ankle Eversion  15      Strength   Strength Assessment Site  Hip;Knee;Ankle    Right/Left Hip  Right;Left    Right Hip Flexion  4+/5    Right Hip ABduction  4+/5    Right Hip ADduction  4+/5    Left Hip Flexion  4/5    Left Hip ABduction  4+/5    Left Hip ADduction  4+/5    Right/Left Knee  Right;Left    Right Knee Flexion  4+/5    Right Knee Extension  4+/5    Left Knee Flexion  4+/5 feeling of cramping in L HS    Left Knee Extension  4+/5    Right/Left Ankle  Right;Left    Right Ankle Dorsiflexion  4/5    Right Ankle Plantar Flexion  4/5    Right Ankle Inversion  4+/5    Right Ankle Eversion  4+/5    Left Ankle Dorsiflexion  4/5    Left Ankle Plantar Flexion  4/5    Left Ankle Inversion  4-/5 pain in L medial arch    Left Ankle Eversion  4+/5      Palpation   Palpation comment  TTP and soft tissure restriction in L posterior tibialis      Ambulation/Gait   Assistive device  None    Gait Pattern  Step-through pattern;Trunk flexed;Decreased stance time - left    Ambulation Surface  Level;Indoor    Gait velocity  [facility]  Objective measurements completed on examination: See above findings.              PT Education - 04/20/18 1156    Education Details  prognosis, POC, HEP    Person(s) Educated  Patient    Methods  Demonstration;Explanation;Tactile cues;Verbal cues;Handout    Comprehension  Returned demonstration;Verbalized understanding       PT Short Term Goals -  04/20/18 1210      PT SHORT TERM GOAL #1   Title  Patient to be independent with initial HEP.    Time  3    Period  Weeks    Status  New    Target Date  05/11/18        PT Long Term Goals - 04/20/18 1210      PT LONG TERM GOAL #1   Title  Patient to be independent with advanced HEP.    Time  6    Period  Weeks    Status  New    Target Date  06/01/18      PT LONG TERM GOAL #2   Title  Patient to demonstate B LE strength >=4+/5 without L foot pain limiting.    Time  6    Period  Weeks    Status  New    Target Date  06/01/18      PT LONG TERM GOAL #3   Title  Patient to demonstrate L ankle AROM WFL and without pain limiting.     Time  6    Period  Weeks    Status  New    Target Date  06/01/18      PT LONG TERM GOAL #4   Title  Patient to report tolerance of 2 hours of walking with <1/10 pain.             Plan - 04/20/18 1209    Clinical Impression Statement  Patient is a 71y/o M presenting to OPPT with c/o intermittent L medial ankle and medial arch pain with of 1 year duration after hitting ankle on lawnmower. Reports aggravating factors as yard work, when walking, and sitting with ankle everted. Patient today with tenderness and soft tissue restriction in L posterior tibialis tendon, and decreased L ankle strength and AROM. Patient educated on gentle stretching and strengthening HEP- given handout and advised not to push into pain. Patient reported understanding. Patient will benefit from skilled PT services 2x/week for 6 weeks to address aforementioned impairments.     Clinical Presentation  Stable    Clinical Decision Making  Low    Rehab Potential  Good    Clinical Impairments Affecting Rehab Potential  osteoporosis, HTN    PT Frequency  2x / week    PT Duration  6 weeks    PT Treatment/Interventions  ADLs/Self Care Home Management;Cryotherapy;Electrical Stimulation;Iontophoresis 4mg /ml Dexamethasone;Moist Heat;Ultrasound;Gait training;Stair training;Functional  mobility training;Therapeutic activities;Therapeutic exercise;Manual techniques;Orthotic Fit/Training;Patient/family education;Neuromuscular re-education;Balance training;Passive range of motion;Dry needling;Energy conservation;Splinting;Taping;Vasopneumatic Device    PT Next Visit Plan  reassess HEP, FOTO    Consulted and Agree with Plan of Care  Patient       Patient will benefit from skilled therapeutic intervention in order to improve the following deficits and impairments:  Hypomobility, Decreased activity tolerance, Decreased strength, Pain, Difficulty walking, Decreased mobility, Decreased balance, Decreased range of motion, Postural dysfunction, Impaired flexibility  Visit Diagnosis: Pain in left foot  Stiffness of left ankle, not elsewhere classified  Muscle weakness (generalized)  Other symptoms and signs involving  the musculoskeletal system  Difficulty in walking, not elsewhere classified     Problem List There are no active problems to display for this patient.   Anette Guarneri, PT, DPT 04/20/18 12:14 PM   [facility] [address]  [address] [place], [place], [address] Phone: [phone]   Fax:  [phone]  Name: Steve [name] MRN: [id number] Date of Birth: 02-14-1946

## 2018-04-28 ENCOUNTER — Encounter: Payer: Medicare Other | Admitting: Physical Therapy

## 2018-04-29 ENCOUNTER — Ambulatory Visit: Payer: Medicare Other

## 2018-04-29 DIAGNOSIS — M79672 Pain in left foot: Secondary | ICD-10-CM | POA: Diagnosis not present

## 2018-04-29 DIAGNOSIS — R262 Difficulty in walking, not elsewhere classified: Secondary | ICD-10-CM

## 2018-04-29 DIAGNOSIS — M6281 Muscle weakness (generalized): Secondary | ICD-10-CM

## 2018-04-29 DIAGNOSIS — M25672 Stiffness of left ankle, not elsewhere classified: Secondary | ICD-10-CM

## 2018-04-29 DIAGNOSIS — R29898 Other symptoms and signs involving the musculoskeletal system: Secondary | ICD-10-CM

## 2018-04-29 NOTE — Therapy (Addendum)
[facility] [address]  [address] [place], [place], [address] Phone: [phone]   Fax:  [phone]  Physical Therapy Treatment  Patient Details  Name: Steve [name] MRN: [id number] Date of Birth: 1946-06-30 Referring Provider: Alfredo MartinezJustin Ollis, PA-C   Encounter Date: 04/29/2018  PT End of Session - 04/29/18 1122    Visit Number  2    Number of Visits  13    Date for PT Re-Evaluation  06/01/18    Authorization Type  UHC Medicare     PT Start Time  1104    PT Stop Time  1151    PT Time Calculation (min)  47 min    Activity Tolerance  Patient tolerated treatment well    Behavior During Therapy  [facility] for tasks assessed/performed       Past Medical History:  Diagnosis Date  . Hypercholesteremia   . Hypertension   . Thyroid disease     Past Surgical History:  Procedure Laterality Date  . NOSE SURGERY      There were no vitals filed for this visit.  Subjective Assessment - 04/29/18 1108    Subjective  Notes, "I was able to do the exercise some days".      Pertinent History  GERD, HTN, osteoporosis    Diagnostic tests  per patient- MD did xray of L foot which showed "ligament fatigued and fallen arch"    Patient Stated Goals  get the L foot well    Currently in Pain?  No/denies    Pain Score  0-No pain    Multiple Pain Sites  No                       [facility] Adult PT Treatment/Exercise - 04/29/18 1122      Manual Therapy   Manual Therapy  Soft tissue mobilization;Passive ROM    Manual therapy comments  supine     Soft tissue mobilization  STM along length of tibialis posterior     Passive ROM  Manual L ankle PROM with therapist in all directions; manual HS+GS stretch with therapist x 30 sec       Ankle Exercises: Aerobic   Recumbent Bike  Lvl 1, 6 min      Ankle Exercises: Stretches   Gastroc Stretch  2 reps;30 seconds Cues to prevent pt. from performing "aggressive" stretch     Gastroc Stretch  Limitations  at wall     Other Stretch  L Tibialis Posterior stretch at wall with towel under lateral foot x 30 sec  cues to prevent pt. from excessive stretch     Other Stretch  L HS + GS stretch with strap x 30 sec       Ankle Exercises: Supine   Other Supine Ankle Exercises  L DF, EV, IV with yellow TB x 15 reps             PT Education - 04/29/18 1252    Education Details  HEP update     Person(s) Educated  Patient    Methods  Explanation;Demonstration;Verbal cues;Handout    Comprehension  Verbalized understanding;Returned demonstration;Verbal cues required;Need further instruction       PT Short Term Goals - 04/29/18 1154      PT SHORT TERM GOAL #1   Title  Patient to be independent with initial HEP.    Time  3    Period  Weeks    Status  On-going        PT Long Term Goals - 04/29/18 1154      PT LONG TERM GOAL #1   Title  Patient to be independent with advanced HEP.    Time  6    Period  Weeks    Status  On-going      PT LONG TERM GOAL #2   Title  Patient to demonstate B LE strength >=4+/5 without L foot pain limiting.    Time  6    Period  Weeks    Status  On-going      PT LONG TERM GOAL #3   Title  Patient to demonstrate L ankle AROM WFL and without pain limiting.     Time  6    Period  Weeks    Status  On-going      PT LONG TERM GOAL #4   Title  Patient to report tolerance of 2 hours of walking with <1/10 pain.            Plan - 04/29/18 1153    Clinical Impression Statement  Pt. reporting he has performed HEP activities, "some days" since first visit.  Did require some cueing with review of HEP to avoid excessive stretching with wall stretch to avoid painful ROM.  Pt. tender at distal L Tib Posterior today thus STM/DTM to this area to promote normalization of tissue quality with good tolerance.  Steve [name] did have some L ankle pain with 4-way ankle with yellow TB at end ranges IV/EV today requiring some vc's to avoid painful end range motion.   Ended session pain free thus modalities deferred.      Clinical Impairments Affecting Rehab Potential  osteoporosis, HTN    PT Treatment/Interventions  ADLs/Self Care Home Management;Cryotherapy;Electrical Stimulation;Iontophoresis 4mg /ml Dexamethasone;Moist Heat;Ultrasound;Gait training;Stair training;Functional mobility training;Therapeutic activities;Therapeutic exercise;Manual techniques;Orthotic Fit/Training;Patient/family education;Neuromuscular re-education;Balance training;Passive range of motion;Dry needling;Energy conservation;Splinting;Taping;Vasopneumatic Device    PT Next Visit Plan  FOTO     Consulted and Agree with Plan of Care  Patient       Patient will benefit from skilled therapeutic intervention in order to improve the following deficits and impairments:  Hypomobility, Decreased activity tolerance, Decreased strength, Pain, Difficulty walking, Decreased mobility, Decreased balance, Decreased range of motion, Postural dysfunction, Impaired flexibility  Visit Diagnosis: Pain in left foot  Stiffness of left ankle, not elsewhere classified  Muscle weakness (generalized)  Other symptoms and signs involving the musculoskeletal system  Difficulty in walking, not elsewhere classified     Problem List There are no active problems to display for this patient.  Steve [name], PTA 04/29/18 12:53 PM   [facility] [address]  [address] [place], [place], [address] Phone: [phone]   Fax:  [phone]  Name: Steve [name] MRN: [id number] Date of Birth: 12-03-1945

## 2018-05-05 ENCOUNTER — Ambulatory Visit: Payer: Medicare Other

## 2018-05-05 DIAGNOSIS — M79672 Pain in left foot: Secondary | ICD-10-CM

## 2018-05-05 DIAGNOSIS — R29898 Other symptoms and signs involving the musculoskeletal system: Secondary | ICD-10-CM

## 2018-05-05 DIAGNOSIS — R262 Difficulty in walking, not elsewhere classified: Secondary | ICD-10-CM

## 2018-05-05 DIAGNOSIS — M25672 Stiffness of left ankle, not elsewhere classified: Secondary | ICD-10-CM

## 2018-05-05 DIAGNOSIS — M6281 Muscle weakness (generalized): Secondary | ICD-10-CM

## 2018-05-05 NOTE — Patient Instructions (Signed)

## 2018-05-05 NOTE — Therapy (Signed)
[facility] [address]  [address] [place], [place], [address] Phone: [phone]   Fax:  [phone]  Physical Therapy Treatment  Patient Details  Name: Steve [name] MRN: [id number] Date of Birth: 1945/11/09 Referring Provider: Alfredo Martinez, PA-C   Encounter Date: 05/05/2018  PT End of Session - 05/05/18 1109    Visit Number  3    Number of Visits  13    Date for PT Re-Evaluation  06/01/18    Authorization Type  UHC Medicare     PT Start Time  1101    PT Stop Time  1143    PT Time Calculation (min)  42 min    Activity Tolerance  Patient tolerated treatment well    Behavior During Therapy  [facility] for tasks assessed/performed       Past Medical History:  Diagnosis Date  . Hypercholesteremia   . Hypertension   . Thyroid disease     Past Surgical History:  Procedure Laterality Date  . NOSE SURGERY      There were no vitals filed for this visit.  Subjective Assessment - 05/05/18 1106    Subjective  Pt. doing well today.  Notes he has not performed standing stretch at wall from updated HEP.      Pertinent History  GERD, HTN, osteoporosis    Diagnostic tests  per patient- MD did xray of L foot which showed "ligament fatigued and fallen arch"    Patient Stated Goals  get the L foot well    Currently in Pain?  No/denies    Pain Score  0-No pain rises to 2/10 at worst with walking    Pain Location  Foot    Pain Orientation  Left    Pain Descriptors / Indicators  Sharp    Pain Type  Chronic pain    Multiple Pain Sites  No                       [facility] Adult PT Treatment/Exercise - 05/05/18 1115      Modalities   Modalities  Iontophoresis      Iontophoresis   Type of Iontophoresis  Dexamethasone    Location  L medial ankle in area of tenderness     Dose  1.70mL, 55mA-min    Time  4-6 hour wear time       Manual Therapy   Manual Therapy  Passive ROM    Manual therapy comments  supine     Passive  ROM  Manual L PF stretch x 30 sec, posterior tib stretch x 30 sec supine PROM with therapist in all directions; manual HS+GS stretch with therapist x 30 sec       Ankle Exercises: Standing   Heel Raises  15 reps;3 seconds;Both    Toe Raise  15 reps;3 seconds    Side Shuffle (Round Trip)  with yellow TB at forefoot at counter x 2 laps down/back       Ankle Exercises: Stretches   Gastroc Stretch  2 reps;30 seconds    Gastroc Stretch Limitations  at wall     Other Stretch  L Tibialis Posterior stretch at wall with towel under lateral foot x 30 sec  towel under lateral foot       Ankle Exercises: Aerobic   Recumbent Bike  Lvl 2, 7 min             PT Education - 05/05/18 1157  Education Details  Biomedical scientistonto eduction handout     Person(s) Educated  Patient    Methods  Explanation;Handout    Comprehension  Verbalized understanding;Verbal cues required       PT Short Term Goals - 04/29/18 1154      PT SHORT TERM GOAL #1   Title  Patient to be independent with initial HEP.    Time  3    Period  Weeks    Status  On-going        PT Long Term Goals - 04/29/18 1154      PT LONG TERM GOAL #1   Title  Patient to be independent with advanced HEP.    Time  6    Period  Weeks    Status  On-going      PT LONG TERM GOAL #2   Title  Patient to demonstate B LE strength >=4+/5 without L foot pain limiting.    Time  6    Period  Weeks    Status  On-going      PT LONG TERM GOAL #3   Title  Patient to demonstrate L ankle AROM WFL and without pain limiting.     Time  6    Period  Weeks    Status  On-going      PT LONG TERM GOAL #4   Title  Patient to report tolerance of 2 hours of walking with <1/10 pain.            Plan - 05/05/18 1109    Clinical Impression Statement  Steve [name] doing well today noting only minor L ankle pain while walking occasionally over past few days.  Feels pain has improved somewhat since starting therapy.  Tolerated advancement of strengthening  activities with addition of standing side-step with yellow TB at forefoot and heel/toe raise well without increased pain.  Still with some tenderness at medial ankle along length of Tib Posterior tendon.  Initiated iontophoresis patch #1/6 as MD signed order for this and pt. in agreement verbalizing understanding of handout including precautions, contraindications, and proper wear time.  Will monitor response and progress per pt. in coming visits.      Clinical Impairments Affecting Rehab Potential  osteoporosis, HTN    PT Treatment/Interventions  ADLs/Self Care Home Management;Cryotherapy;Electrical Stimulation;Iontophoresis 4mg /ml Dexamethasone;Moist Heat;Ultrasound;Gait training;Stair training;Functional mobility training;Therapeutic activities;Therapeutic exercise;Manual techniques;Orthotic Fit/Training;Patient/family education;Neuromuscular re-education;Balance training;Passive range of motion;Dry needling;Energy conservation;Splinting;Taping;Vasopneumatic Device    Consulted and Agree with Plan of Care  Patient       Patient will benefit from skilled therapeutic intervention in order to improve the following deficits and impairments:  Hypomobility, Decreased activity tolerance, Decreased strength, Pain, Difficulty walking, Decreased mobility, Decreased balance, Decreased range of motion, Postural dysfunction, Impaired flexibility  Visit Diagnosis: Pain in left foot  Stiffness of left ankle, not elsewhere classified  Muscle weakness (generalized)  Other symptoms and signs involving the musculoskeletal system  Difficulty in walking, not elsewhere classified     Problem List There are no active problems to display for this patient.   Kermit BaloMicah Lashondra Vaquerano, PTA 05/05/18 11:58 AM   [facility] [address]  [address] [place], [place], [address] Phone: [phone]   Fax:  [phone]  Name: Steve [name] MRN: [id number] Date of Birth:  03-Jun-1946

## 2018-05-07 ENCOUNTER — Encounter: Payer: Self-pay | Admitting: Physical Therapy

## 2018-05-07 ENCOUNTER — Ambulatory Visit: Payer: Medicare Other | Admitting: Physical Therapy

## 2018-05-07 DIAGNOSIS — M6281 Muscle weakness (generalized): Secondary | ICD-10-CM

## 2018-05-07 DIAGNOSIS — R262 Difficulty in walking, not elsewhere classified: Secondary | ICD-10-CM

## 2018-05-07 DIAGNOSIS — M25672 Stiffness of left ankle, not elsewhere classified: Secondary | ICD-10-CM

## 2018-05-07 DIAGNOSIS — M79672 Pain in left foot: Secondary | ICD-10-CM | POA: Diagnosis not present

## 2018-05-07 DIAGNOSIS — R29898 Other symptoms and signs involving the musculoskeletal system: Secondary | ICD-10-CM

## 2018-05-07 NOTE — Therapy (Signed)
[facility] [address]  [address] [place], [place], [address] Phone: [phone]   Fax:  [phone]  Physical Therapy Treatment  Patient Details  Name: Steve [name] MRN: [id number] Date of Birth: 02-09-1946 Referring Provider: Alfredo Martinez, PA-C   Encounter Date: 05/07/2018  PT End of Session - 05/07/18 1153    Visit Number  4    Number of Visits  13    Date for PT Re-Evaluation  06/01/18    Authorization Type  UHC Medicare     PT Start Time  1105    PT Stop Time  1148    PT Time Calculation (min)  43 min    Activity Tolerance  Patient tolerated treatment well    Behavior During Therapy  [facility] for tasks assessed/performed       Past Medical History:  Diagnosis Date  . Hypercholesteremia   . Hypertension   . Thyroid disease     Past Surgical History:  Procedure Laterality Date  . NOSE SURGERY      There were no vitals filed for this visit.  Subjective Assessment - 05/07/18 1106    Subjective  Reports foot is feeling better overall. Ionto patch helped. Worked out in the yard yesterday and having a little pain from that.     Pertinent History  GERD, HTN, osteoporosis    Diagnostic tests  per patient- MD did xray of L foot which showed "ligament fatigued and fallen arch"    Patient Stated Goals  get the L foot well    Currently in Pain?  Yes    Pain Score  2     Pain Location  Foot    Pain Orientation  Left    Pain Descriptors / Indicators  Sharp    Pain Type  Chronic pain                       [facility] Adult PT Treatment/Exercise - 05/07/18 0001      Manual Therapy   Manual Therapy  Passive ROM;Other (comment)    Manual therapy comments  supine     Soft tissue mobilization  L pos tib- most soft tissue restriction in distal muscle; tenderness but tolerable    Passive ROM  passive L DF stretch, passive EV stretch, Passive INV stretch, 2x30 sec each reporting mild pain but tolerable    Other  Manual Therapy  L posterior tib ice massage x 5 min      Ankle Exercises: Aerobic   Nustep  L4x6 min      Ankle Exercises: Seated   Heel Raises  Both;15 reps;Limitations    Heel Raises Limitations  10# on each LE    Toe Raise  15 reps;Limitations      Ankle Exercises: Standing   SLS  Edu and practice performing L SLS at counter top x 5 min pt requireing heavy cues for weight shift and glute activati    Heel Raises  Both;10 reps;Limitations    Heel Raises Limitations  ball between heels at counter top; 2x 10    Side Shuffle (Round Trip)  sidestepping red TB around toes at counter top 4x length of counter top cues not to rush, stand up tall    Other Standing Ankle Exercises  standing on foam weight shifts ant/pos and M/L at counter top; x 3 min cues to avoid lifting LEs      Ankle Exercises: Doctor, hospital  2 reps;30 seconds    Gastroc Stretch Limitations  prostretch at counter               PT Short Term Goals - 04/29/18 1154      PT SHORT TERM GOAL #1   Title  Patient to be independent with initial HEP.    Time  3    Period  Weeks    Status  On-going        PT Long Term Goals - 04/29/18 1154      PT LONG TERM GOAL #1   Title  Patient to be independent with advanced HEP.    Time  6    Period  Weeks    Status  On-going      PT LONG TERM GOAL #2   Title  Patient to demonstate B LE strength >=4+/5 without L foot pain limiting.    Time  6    Period  Weeks    Status  On-going      PT LONG TERM GOAL #3   Title  Patient to demonstrate L ankle AROM WFL and without pain limiting.     Time  6    Period  Weeks    Status  On-going      PT LONG TERM GOAL #4   Title  Patient to report tolerance of 2 hours of walking with <1/10 pain.            Plan - 05/07/18 1154    Clinical Impression Statement  Patient arrived to session with no new complaints. Reports benefit from ionto used last session. Patient reporting intermittent compliance with HEP-  advised patient to try performing HEP with his wife as she is also a patient. Tolerated STM to L posterior tib- patient with moderate edema, soft tissue restriction, and tenderness distally. Also tolerated passive stretching into DF, INV, ER with c/o mild pain but tolerable. Focused on SLS and weight shifts on foam to challenge ankle and hip stability. Patient requiring heavy cues for weight shift, upright trunk, and glute activation. Received ice massage to L pos tib at end of session for edema and pain control. Patient with good tolerance and normal integumentary response at end of session. Denied pain at conclusion of appointment.     PT Treatment/Interventions  ADLs/Self Care Home Management;Cryotherapy;Electrical Stimulation;Iontophoresis 4mg /ml Dexamethasone;Moist Heat;Ultrasound;Gait training;Stair training;Functional mobility training;Therapeutic activities;Therapeutic exercise;Manual techniques;Orthotic Fit/Training;Patient/family education;Neuromuscular re-education;Balance training;Passive range of motion;Dry needling;Energy conservation;Splinting;Taping;Vasopneumatic Device    Consulted and Agree with Plan of Care  Patient       Patient will benefit from skilled therapeutic intervention in order to improve the following deficits and impairments:  Hypomobility, Decreased activity tolerance, Decreased strength, Pain, Difficulty walking, Decreased mobility, Decreased balance, Decreased range of motion, Postural dysfunction, Impaired flexibility  Visit Diagnosis: Pain in left foot  Stiffness of left ankle, not elsewhere classified  Muscle weakness (generalized)  Other symptoms and signs involving the musculoskeletal system  Difficulty in walking, not elsewhere classified     Problem List There are no active problems to display for this patient.   Anette GuarneriYevgeniya Kovalenko, PT, DPT 05/07/18 12:01 PM   [facility] [address]   [address] [place], [place], [address] Phone: [phone]   Fax:  [phone]  Name: Steve [name] MRN: [id number] Date of Birth: 11-21-1945

## 2018-05-12 ENCOUNTER — Ambulatory Visit: Payer: Medicare Other

## 2018-05-12 DIAGNOSIS — R262 Difficulty in walking, not elsewhere classified: Secondary | ICD-10-CM

## 2018-05-12 DIAGNOSIS — M79672 Pain in left foot: Secondary | ICD-10-CM | POA: Diagnosis not present

## 2018-05-12 DIAGNOSIS — M6281 Muscle weakness (generalized): Secondary | ICD-10-CM

## 2018-05-12 DIAGNOSIS — M25672 Stiffness of left ankle, not elsewhere classified: Secondary | ICD-10-CM

## 2018-05-12 DIAGNOSIS — R29898 Other symptoms and signs involving the musculoskeletal system: Secondary | ICD-10-CM

## 2018-05-12 NOTE — Therapy (Signed)
[facility] [address]  [address] [place], [place], [address] Phone: [phone]   Fax:  [phone]  Physical Therapy Treatment  Patient Details  Name: Steve [name] MRN: [id number] Date of Birth: [birth year] Referring Provider: Alfredo Martinez, PA-C   Encounter Date: 05/12/2018  PT End of Session - 05/12/18 1111    Visit Number  5    Number of Visits  13    Date for PT Re-Evaluation  06/01/18    Authorization Type  UHC Medicare     PT Start Time  1112 pt. arrived late     PT Stop Time  1150    PT Time Calculation (min)  38 min    Activity Tolerance  Patient tolerated treatment well    Behavior During Therapy  WFL for tasks assessed/performed       Past Medical History:  Diagnosis Date  . Hypercholesteremia   . Hypertension   . Thyroid disease     Past Surgical History:  Procedure Laterality Date  . NOSE SURGERY      There were no vitals filed for this visit.  Subjective Assessment - 05/12/18 1122    Subjective  Pt. noting 80 % improvement in pain since starting therapy.     Pertinent History  GERD, HTN, osteoporosis    How long can you stand comfortably?  not limited by pain, limited by fatigue    Diagnostic tests  per patient- MD did xray of L foot which showed "ligament fatigued and fallen arch"    Patient Stated Goals  get the L foot well    Currently in Pain?  No/denies    Pain Score  0-No pain    Multiple Pain Sites  No                       [facility] Adult PT Treatment/Exercise - 05/12/18 1142      Neuro Re-ed    Neuro Re-ed Details   on red mat: front bolster step over, side bolster step over 3 x 5 laps each way       Ankle Exercises: Seated   Heel Raises  --    Heel Raises Limitations  --    Other Seated Ankle Exercises  Seated L DF, EV, IR with red TB x 15 reps each way       Ankle Exercises: Aerobic   Recumbent Bike  Lvl 2, 7 min      Ankle Exercises: Standing   SLS  B SLS 3 x  5 sec with UE support on TM; much difficulty and report of slight L knee pain thus terminated     Heel Raises  Both;15 reps;Limitations at UBE    Other Standing Ankle Exercises  alternating BOSU ball (up) step and lean x 10 reps each way     Other Standing Ankle Exercises  High knee march at counter (slow motion) x 3 laps down back; intermittent counter support       Ankle Exercises: Stretches   Other Stretch  L Tibialis Posterior stretch at wall with towel under lateral foot 2 x 30 sec                PT Short Term Goals - 05/12/18 1117      PT SHORT TERM GOAL #1   Title  Patient to be independent with initial HEP.    Time  3    Period  Weeks  Status  Achieved        PT Long Term Goals - 04/29/18 1154      PT LONG TERM GOAL #1   Title  Patient to be independent with advanced HEP.    Time  6    Period  Weeks    Status  On-going      PT LONG TERM GOAL #2   Title  Patient to demonstate B LE strength >=4+/5 without L foot pain limiting.    Time  6    Period  Weeks    Status  On-going      PT LONG TERM GOAL #3   Title  Patient to demonstrate L ankle AROM WFL and without pain limiting.     Time  6    Period  Weeks    Status  On-going      PT LONG TERM GOAL #4   Title  Patient to report tolerance of 2 hours of walking with <1/10 pain.            Plan - 05/12/18 1117    Clinical Impression Statement  Steve [name] seen to start session reporting improvement in L ankle/foot pain ~ 80% since starting therapy.  Noting he is frequently performing HEP now.  Tolerated additional standing balance and stepping activities on compliant surface well today.  Does require cueing for proper movement pattern and pacing with most activities in session.  Progressing well at this point.      PT Treatment/Interventions  ADLs/Self Care Home Management;Cryotherapy;Electrical Stimulation;Iontophoresis 4mg /ml Dexamethasone;Moist Heat;Ultrasound;Gait training;Stair training;Functional  mobility training;Therapeutic activities;Therapeutic exercise;Manual techniques;Orthotic Fit/Training;Patient/family education;Neuromuscular re-education;Balance training;Passive range of motion;Dry needling;Energy conservation;Splinting;Taping;Vasopneumatic Device    Consulted and Agree with Plan of Care  Patient       Patient will benefit from skilled therapeutic intervention in order to improve the following deficits and impairments:  Hypomobility, Decreased activity tolerance, Decreased strength, Pain, Difficulty walking, Decreased mobility, Decreased balance, Decreased range of motion, Postural dysfunction, Impaired flexibility  Visit Diagnosis: Pain in left foot  Stiffness of left ankle, not elsewhere classified  Muscle weakness (generalized)  Other symptoms and signs involving the musculoskeletal system  Difficulty in walking, not elsewhere classified     Problem List There are no active problems to display for this patient.   Kermit BaloMicah Denny, PTA 05/12/18 12:20 PM    [facility] [address]  [address] [place], [place], [address] Phone: [phone]   Fax:  [phone]  Name: Steve [name] MRN: [id number] Date of Birth: 11-10-1945

## 2018-05-14 ENCOUNTER — Ambulatory Visit: Payer: Medicare Other

## 2018-05-14 DIAGNOSIS — M79672 Pain in left foot: Secondary | ICD-10-CM

## 2018-05-14 DIAGNOSIS — M6281 Muscle weakness (generalized): Secondary | ICD-10-CM

## 2018-05-14 DIAGNOSIS — R262 Difficulty in walking, not elsewhere classified: Secondary | ICD-10-CM

## 2018-05-14 DIAGNOSIS — M25672 Stiffness of left ankle, not elsewhere classified: Secondary | ICD-10-CM

## 2018-05-14 DIAGNOSIS — R29898 Other symptoms and signs involving the musculoskeletal system: Secondary | ICD-10-CM

## 2018-05-14 NOTE — Therapy (Signed)
[facility] [address]  [address] [place], [place], [address] Phone: [phone]   Fax:  [phone]  Physical Therapy Treatment  Patient Details  Name: Steve [name] MRN: [id number] Date of Birth: [birth year] Referring Provider: Alfredo Martinez, PA-C   Encounter Date: 05/14/2018  PT End of Session - 05/14/18 1109    Visit Number  6    Number of Visits  13    Date for PT Re-Evaluation  06/01/18    Authorization Type  UHC Medicare     PT Start Time  1102    PT Stop Time  1140    PT Time Calculation (min)  38 min    Activity Tolerance  Patient tolerated treatment well    Behavior During Therapy  [facility] for tasks assessed/performed       Past Medical History:  Diagnosis Date  . Hypercholesteremia   . Hypertension   . Thyroid disease     Past Surgical History:  Procedure Laterality Date  . NOSE SURGERY      There were no vitals filed for this visit.  Subjective Assessment - 05/14/18 1108    Subjective  Pt. noting some L knee pain followin last visit.      Pertinent History  GERD, HTN, osteoporosis    Diagnostic tests  per patient- MD did xray of L foot which showed "ligament fatigued and fallen arch"    Patient Stated Goals  get the L foot well    Currently in Pain?  No/denies    Pain Score  0-No pain    Multiple Pain Sites  No                       [facility] Adult PT Treatment/Exercise - 05/14/18 1118      Manual Therapy   Manual Therapy  Passive ROM    Passive ROM  Passive L posterior tib stretch with therapist and HS stretch x 30 sec      Ankle Exercises: Aerobic   Recumbent Bike  Lvl 2, 7 min      Ankle Exercises: Standing   SLS  B SLS x 20 sec each way  holding onto counter as pt. noting knee pain after not hold    Heel Raises  Both;20 reps;Limitations    Balance Beam  Side stepping on foam beam x 5 laps  at counter       Ankle Exercises: Machines for Strengthening   Cybex Leg Press  B  LE's: 25# x 15 reps; B heel raise 30# x 15 reps       Ankle Exercises: Seated   Other Seated Ankle Exercises  Seated L DF, EV, IR with red TB x 15 reps each way  3" eccentric      Ankle Exercises: Stretches   Gastroc Stretch  2 reps;30 seconds R and L    Gastroc Stretch Limitations  into wall                PT Short Term Goals - 05/12/18 1117      PT SHORT TERM GOAL #1   Title  Patient to be independent with initial HEP.    Time  3    Period  Weeks    Status  Achieved        PT Long Term Goals - 05/14/18 1133      PT LONG TERM GOAL #1   Title  Patient to be independent with  advanced HEP.    Time  6    Period  Weeks    Status  On-going      PT LONG TERM GOAL #2   Title  Patient to demonstate B LE strength >=4+/5 without L foot pain limiting.    Time  6    Period  Weeks    Status  On-going      PT LONG TERM GOAL #3   Title  Patient to demonstrate L ankle AROM WFL and without pain limiting.     Time  6    Period  Weeks    Status  On-going      PT LONG TERM GOAL #4   Title  Patient to report tolerance of 2 hours of walking with <1/10 pain.            Plan - 05/14/18 1109    Clinical Impression Statement  Pt. noting some L knee pain following last visit which he attributes to SLS activities performed in last session.  Was pain free throughout session today and unable to reproduce L knee pain with SLS activities in session today.  Steve [name] seems to be progressing well with L ankle strengthening and stability activities at this point.  Notes he felt some short-lasting ankle soreness after prolonged yard work last week and yardwork seems to be iving him most trouble.  Will continue to progress toward goals.      PT Treatment/Interventions  ADLs/Self Care Home Management;Cryotherapy;Electrical Stimulation;Iontophoresis 4mg /ml Dexamethasone;Moist Heat;Ultrasound;Gait training;Stair training;Functional mobility training;Therapeutic activities;Therapeutic  exercise;Manual techniques;Orthotic Fit/Training;Patient/family education;Neuromuscular re-education;Balance training;Passive range of motion;Dry needling;Energy conservation;Splinting;Taping;Vasopneumatic Device    Consulted and Agree with Plan of Care  Patient       Patient will benefit from skilled therapeutic intervention in order to improve the following deficits and impairments:  Hypomobility, Decreased activity tolerance, Decreased strength, Pain, Difficulty walking, Decreased mobility, Decreased balance, Decreased range of motion, Postural dysfunction, Impaired flexibility  Visit Diagnosis: Pain in left foot  Stiffness of left ankle, not elsewhere classified  Muscle weakness (generalized)  Other symptoms and signs involving the musculoskeletal system  Difficulty in walking, not elsewhere classified     Problem List There are no active problems to display for this patient.   Kermit BaloMicah Denny, PTA 05/14/18 11:51 AM   [facility] [address]  [address] [place], [place], [address] Phone: [phone]   Fax:  [phone]  Name: Steve [name] MRN: [id number] Date of Birth: June 26, 1946

## 2018-05-19 ENCOUNTER — Ambulatory Visit: Payer: Medicare Other

## 2018-05-19 DIAGNOSIS — M79672 Pain in left foot: Secondary | ICD-10-CM | POA: Diagnosis not present

## 2018-05-19 DIAGNOSIS — R262 Difficulty in walking, not elsewhere classified: Secondary | ICD-10-CM

## 2018-05-19 DIAGNOSIS — M25672 Stiffness of left ankle, not elsewhere classified: Secondary | ICD-10-CM

## 2018-05-19 DIAGNOSIS — M6281 Muscle weakness (generalized): Secondary | ICD-10-CM

## 2018-05-19 DIAGNOSIS — R29898 Other symptoms and signs involving the musculoskeletal system: Secondary | ICD-10-CM

## 2018-05-19 NOTE — Therapy (Signed)
[facility] [address]  [place], [place], [address] Phone: [phone]   Fax:  [phone]  Physical Therapy Treatment  Patient Details  Name: Darril Patriarca MRN: [id number] Date of Birth: 09-May-1946 Referring Provider: Mechele Claude, PA-C   Encounter Date: 05/19/2018  PT End of Session - 05/19/18 1104    Visit Number  7    Number of Visits  13    Date for PT Re-Evaluation  06/01/18    Authorization Type  UHC Medicare     PT Start Time  1101    PT Stop Time  1145    PT Time Calculation (min)  44 min    Activity Tolerance  Patient tolerated treatment well    Behavior During Therapy  [facility] for tasks assessed/performed       Past Medical History:  Diagnosis Date  . Hypercholesteremia   . Hypertension   . Thyroid disease     Past Surgical History:  Procedure Laterality Date  . NOSE SURGERY      There were no vitals filed for this visit.  Subjective Assessment - 05/19/18 1105    Subjective  Pt. reporting some L ankle pain for remainder of day following, "nine hours of yardwork on Sunday".      Pertinent History  GERD, HTN, osteoporosis    Diagnostic tests  per patient- MD did xray of L foot which showed "ligament fatigued and fallen arch"    Patient Stated Goals  get the L foot well    Currently in Pain?  No/denies    Pain Score  0-No pain    Multiple Pain Sites  No                       [facility] Adult PT Treatment/Exercise - 05/19/18 1132      Manual Therapy   Manual Therapy  Passive ROM;Soft tissue mobilization    Soft tissue mobilization  STM to L pos tib; some remaining tenderness in distal     Passive ROM  Passive L posterior tib stretch with therapist and HS stretch x 30 sec      Ankle Exercises: Standing   Vector Stance  Right;Left;2 reps x 20 sec each LE    Vector Stance Limitations  with opposite LE toe-touch to cones     SLS  B SLS 2 x 20 sec on foam with 1 ski pole and  supervision     Heel Raises  Both;20 reps;Limitations    Heel Raises Limitations  at UBE    Other Standing Ankle Exercises  Functional squat x 10 reps  cues required for proper positioning       Ankle Exercises: Aerobic   Recumbent Bike  Lvl 2, 7 min      Ankle Exercises: Seated   Other Seated Ankle Exercises  Seated B ankle ER with red TB at forefoot x 15 rpes       Ankle Exercises: Supine   Other Supine Ankle Exercises  Alternating high knee march with red TB at forefoot and LE resting on peanut p-ball x 15 reps       Ankle Exercises: Stretches   Gastroc Stretch  2 reps;30 seconds    Gastroc Stretch Limitations  into wall              PT Education - 05/19/18 1210    Education Details  HEP update     Person(s) Educated  Patient  Methods  Explanation;Demonstration;Verbal cues;Handout    Comprehension  Verbalized understanding;Returned demonstration;Verbal cues required;Need further instruction       PT Short Term Goals - 05/12/18 1117      PT SHORT TERM GOAL #1   Title  Patient to be independent with initial HEP.    Time  3    Period  Weeks    Status  Achieved        PT Long Term Goals - 05/19/18 1150      PT LONG TERM GOAL #1   Title  Patient to be independent with advanced HEP.    Time  6    Period  Weeks    Status  Partially Met met for current HEP       PT LONG TERM GOAL #2   Title  Patient to demonstate B LE strength >=4+/5 without L foot pain limiting.    Time  6    Period  Weeks    Status  On-going      PT LONG TERM GOAL #3   Title  Patient to demonstrate L ankle AROM WFL and without pain limiting.     Time  6    Period  Weeks    Status  On-going      PT LONG TERM GOAL #4   Title  Patient to report tolerance of 2 hours of walking with <1/10 pain.    Status  Achieved            Plan - 05/19/18 1105    Clinical Impression Statement  Pt. seen today noting he had increased pain yesterday which he attributes to doing, "doing nine hours  of yardwork on Sunday".  Pt. tolerated progression of SLS activities and addition of vector SLS activities well today.  Pain free throughout therex today aside from occasional complaint of L great toe pain, which he reports, is, "Eaten up with Arthritis".  HEP updated with SLT and tandem stance activities.  Pt. noting ~ 95% improvement in ankle pain levels since starting therapy.  Pt. may be appropriate for transition to home program over next few visit.  Will plan to monitor tolerance to updated HEP and progress accordingly in coming sessions.      PT Treatment/Interventions  ADLs/Self Care Home Management;Cryotherapy;Electrical Stimulation;Iontophoresis 30m/ml Dexamethasone;Moist Heat;Ultrasound;Gait training;Stair training;Functional mobility training;Therapeutic activities;Therapeutic exercise;Manual techniques;Orthotic Fit/Training;Patient/family education;Neuromuscular re-education;Balance training;Passive range of motion;Dry needling;Energy conservation;Splinting;Taping;Vasopneumatic Device    Consulted and Agree with Plan of Care  Patient       Patient will benefit from skilled therapeutic intervention in order to improve the following deficits and impairments:  Hypomobility, Decreased activity tolerance, Decreased strength, Pain, Difficulty walking, Decreased mobility, Decreased balance, Decreased range of motion, Postural dysfunction, Impaired flexibility  Visit Diagnosis: Pain in left foot  Stiffness of left ankle, not elsewhere classified  Muscle weakness (generalized)  Other symptoms and signs involving the musculoskeletal system  Difficulty in walking, not elsewhere classified     Problem List There are no active problems to display for this patient.   MBess Harvest PTA 05/19/18 12:11 PM   [place] [address] [place] [address]Phone: [phone]  Fax:  [phone] Name: CSamaad HashemMRN: [id number] of Birth: 8Oct 28, 1947

## 2018-05-21 ENCOUNTER — Ambulatory Visit: Payer: Medicare Other | Attending: Student

## 2018-05-21 DIAGNOSIS — R262 Difficulty in walking, not elsewhere classified: Secondary | ICD-10-CM

## 2018-05-21 DIAGNOSIS — M25672 Stiffness of left ankle, not elsewhere classified: Secondary | ICD-10-CM

## 2018-05-21 DIAGNOSIS — R29898 Other symptoms and signs involving the musculoskeletal system: Secondary | ICD-10-CM

## 2018-05-21 DIAGNOSIS — M6281 Muscle weakness (generalized): Secondary | ICD-10-CM

## 2018-05-21 DIAGNOSIS — M79672 Pain in left foot: Secondary | ICD-10-CM | POA: Diagnosis present

## 2018-05-21 NOTE — Therapy (Addendum)
[facility] [address]  [place], [place], [address] Phone: [phone]   Fax:  [phone]  Physical Therapy Treatment  Patient Details  Name: Steve [name] MRN: [id number] Date of Birth: 11-30-1945 Referring Provider: Mechele Claude, PA-C   Progress Note Reporting Period 04/20/18 to 05/21/18  See note below for Objective Data and Assessment of Progress/Goals.    Encounter Date: 05/21/2018  PT End of Session - 05/21/18 1107    Visit Number  8    Number of Visits  13    Date for PT Re-Evaluation  06/01/18    Authorization Type  [employer]HC Medicare     PT Start Time  1100    PT Stop Time  1145    PT Time Calculation (min)  45 min    Activity Tolerance  Patient tolerated treatment well    Behavior During Therapy  WFL for tasks assessed/performed       Past Medical History:  Diagnosis Date  . Hypercholesteremia   . Hypertension   . Thyroid disease     Past Surgical History:  Procedure Laterality Date  . NOSE SURGERY      There were no vitals filed for this visit.  Subjective Assessment - 05/21/18 1105    Subjective  Pt. doing well today and feels he may be ready to transition to home program following today.      Pertinent History  GERD, HTN, osteoporosis    Diagnostic tests  per patient- MD did xray of L foot which showed "ligament fatigued and fallen arch"    Patient Stated Goals  get the L foot well    Currently in Pain?  No/denies    Pain Score  0-No pain    Multiple Pain Sites  No         [facility] PT Assessment - 05/21/18 1112      AROM   Right Ankle Dorsiflexion  20    Right Ankle Plantar Flexion  44    Right Ankle Inversion  32 3/10    Right Ankle Eversion  23    Left Ankle Dorsiflexion  5    Left Ankle Plantar Flexion  45    Left Ankle Inversion  34    Left Ankle Eversion  15      Strength   Right/Left Hip  Right;Left    Right Hip Flexion  4+/5    Right Hip ABduction  4+/5    Right Hip  ADduction  4+/5    Left Hip Flexion  4+/5    Left Hip ABduction  4+/5    Left Hip ADduction  4+/5    Right/Left Knee  Right;Left    Right Knee Flexion  5/5    Right Knee Extension  5/5    Left Knee Flexion  5/5    Left Knee Extension  5/5    Right/Left Ankle  Right;Left    Right Ankle Dorsiflexion  4+/5    Right Ankle Plantar Flexion  4+/5    Right Ankle Inversion  4+/5    Right Ankle Eversion  4+/5    Left Ankle Dorsiflexion  4+/5    Left Ankle Plantar Flexion  4+/5    Left Ankle Inversion  4/5    Left Ankle Eversion  4+/5                   [facility] Adult PT Treatment/Exercise - 05/21/18 1151      Ankle Exercises:  Standing   Vector Stance  2 reps;Left x 20 sec with "clocks"; light UE support     Vector Stance Limitations  light UE support     Heel Raises  Both;20 reps;Limitations    Heel Raises Limitations  at UBE    Other Standing Ankle Exercises  B tandem stance 2 x 20 sec with eyes closed and light UE support     Other Standing Ankle Exercises  B SLS with eyes closed and light UE support on counter 2 x 15 sec each       Ankle Exercises: Aerobic   Nustep  L6x7 min      Ankle Exercises: Stretches   Other Stretch  L Tibialis Posterior stretch at wall with towel under lateral foot 2 x 30 sec  Some cueing required for proper towel placement              PT Education - 05/21/18 1158    Education Details  HEP update     Person(s) Educated  Patient    Methods  Explanation;Demonstration;Verbal cues    Comprehension  Verbalized understanding;Returned demonstration;Verbal cues required;Need further instruction       PT Short Term Goals - 05/12/18 1117      PT SHORT TERM GOAL #1   Title  Patient to be independent with initial HEP.    Time  3    Period  Weeks    Status  Achieved        PT Long Term Goals - 05/21/18 1109      PT LONG TERM GOAL #1   Title  Patient to be independent with advanced HEP.    Time  6    Period  Weeks    Status  Achieved       PT LONG TERM GOAL #2   Title  Patient to demonstate B LE strength >=4+/5 without L foot pain limiting.    Time  6    Period  Weeks    Status  Achieved      PT LONG TERM GOAL #3   Title  Patient to demonstrate L ankle AROM WFL and without pain limiting.     Time  6    Period  Weeks    Status  Partially Met met for all except PF ROM      PT LONG TERM GOAL #4   Title  Patient to report tolerance of 2 hours of walking with <1/10 pain.    Status  Achieved            Plan - 05/21/18 1109    Clinical Impression Statement  Pt. doing well today noting no recent L ankle pain now with daily activities.  Pt. has now achieved or partially achieved all LTG's at this point and is open to transitioning to home program.  After discussion with supervising PT and therapist, pt. wishing to go on 30-day hold from therapy.  Comprehensive HEP reviewed with pt. today with band resistance updated and heel raise added with pt. verbalizing understanding.  Pt. now on 30-day hold.      PT Treatment/Interventions  ADLs/Self Care Home Management;Cryotherapy;Electrical Stimulation;Iontophoresis 45m/ml Dexamethasone;Moist Heat;Ultrasound;Gait training;Stair training;Functional mobility training;Therapeutic activities;Therapeutic exercise;Manual techniques;Orthotic Fit/Training;Patient/family education;Neuromuscular re-education;Balance training;Passive range of motion;Dry needling;Energy conservation;Splinting;Taping;Vasopneumatic Device    PT Next Visit Plan  30-dah hold     Consulted and Agree with Plan of Care  Patient       Patient will benefit from skilled therapeutic intervention in order to  improve the following deficits and impairments:  Hypomobility, Decreased activity tolerance, Decreased strength, Pain, Difficulty walking, Decreased mobility, Decreased balance, Decreased range of motion, Postural dysfunction, Impaired flexibility  Visit Diagnosis: Pain in left foot  Stiffness of left ankle, not  elsewhere classified  Muscle weakness (generalized)  Other symptoms and signs involving the musculoskeletal system  Difficulty in walking, not elsewhere classified     Problem List There are no active problems to display for this patient.   Bess Harvest, PTA 05/21/18 11:59 AM   [facility] [address]  [place], [place], [address] Phone: [phone]   Fax:  [phone]  Name: Patryk Conant MRN: [id number] Date of Birth: 04-Aug-1946  PHYSICAL THERAPY DISCHARGE SUMMARY  Visits from Start of Care: 8  Current functional level related to goals / functional outcomes: See above clinical impression; patient did not return after being placed on 30 day hold   Remaining deficits: Decreased L ankle PF ROM   Education / Equipment: HEP  Plan: Patient agrees to discharge.  Patient goals were partially met. Patient is being discharged due to being pleased with the current functional level.  ?????     Janene Harvey, PT, DPT 06/24/18 9:07 AM

## 2018-06-04 ENCOUNTER — Encounter: Payer: Medicare Other | Admitting: Physical Therapy

## 2019-04-21 ENCOUNTER — Encounter (HOSPITAL_BASED_OUTPATIENT_CLINIC_OR_DEPARTMENT_OTHER): Payer: Self-pay

## 2019-04-21 ENCOUNTER — Emergency Department (HOSPITAL_BASED_OUTPATIENT_CLINIC_OR_DEPARTMENT_OTHER): Payer: Medicare Other

## 2019-04-21 ENCOUNTER — Emergency Department (HOSPITAL_BASED_OUTPATIENT_CLINIC_OR_DEPARTMENT_OTHER)
Admission: EM | Admit: 2019-04-21 | Discharge: 2019-04-21 | Disposition: A | Discharge status: Home / Self Care | Payer: Medicare Other | Attending: Emergency Medicine | Admitting: Emergency Medicine

## 2019-04-21 ENCOUNTER — Other Ambulatory Visit: Payer: Self-pay

## 2019-04-21 DIAGNOSIS — R9389 Abnormal findings on diagnostic imaging of other specified body structures: Secondary | ICD-10-CM | POA: Diagnosis not present

## 2019-04-21 DIAGNOSIS — F0390 Unspecified dementia without behavioral disturbance: Secondary | ICD-10-CM | POA: Insufficient documentation

## 2019-04-21 DIAGNOSIS — R1032 Left lower quadrant pain: Secondary | ICD-10-CM | POA: Diagnosis present

## 2019-04-21 DIAGNOSIS — Z880 Allergy status to penicillin: Secondary | ICD-10-CM | POA: Insufficient documentation

## 2019-04-21 DIAGNOSIS — Z79899 Other long term (current) drug therapy: Secondary | ICD-10-CM | POA: Diagnosis not present

## 2019-04-21 DIAGNOSIS — I10 Essential (primary) hypertension: Secondary | ICD-10-CM | POA: Diagnosis not present

## 2019-04-21 DIAGNOSIS — Z7982 Long term (current) use of aspirin: Secondary | ICD-10-CM | POA: Diagnosis not present

## 2019-04-21 DIAGNOSIS — K5792 Diverticulitis of intestine, part unspecified, without perforation or abscess without bleeding: Secondary | ICD-10-CM | POA: Diagnosis not present

## 2019-04-21 HISTORY — DX: Unspecified dementia, unspecified severity, without behavioral disturbance, psychotic disturbance, mood disturbance, and anxiety: F03.90

## 2019-04-21 HISTORY — DX: Diverticulitis of intestine, part unspecified, without perforation or abscess without bleeding: K57.92

## 2019-04-21 HISTORY — DX: Age-related osteoporosis without current pathological fracture: M81.0

## 2019-04-21 LAB — COMPREHENSIVE METABOLIC PANEL
ALT: 32 U/L (ref 0–44)
AST: 28 U/L (ref 15–41)
Albumin: 4.2 g/dL (ref 3.5–5.0)
Alkaline Phosphatase: 41 U/L (ref 38–126)
Anion gap: 8 (ref 5–15)
BUN: 13 mg/dL (ref 8–23)
CO2: 23 mmol/L (ref 22–32)
Calcium: 9.5 mg/dL (ref 8.9–10.3)
Chloride: 107 mmol/L (ref 98–111)
Creatinine, Ser: 0.9 mg/dL (ref 0.61–1.24)
GFR calc Af Amer: >60 mL/min (ref >60)
GFR calc non Af Amer: >60 mL/min (ref >60)
Glucose, Bld: 89 mg/dL (ref 70–99)
Potassium: 4.1 mmol/L (ref 3.5–5.1)
Sodium: 138 mmol/L (ref 135–145)
Total Bilirubin: 0.7 mg/dL (ref 0.3–1.2)
Total Protein: 7.6 g/dL (ref 6.5–8.1)

## 2019-04-21 LAB — CBC WITH DIFFERENTIAL/PLATELET
Abs Immature Granulocytes: 0.03 10*3/uL (ref 0.00–0.07)
Basophils Absolute: 0.1 10*3/uL (ref 0.0–0.1)
Basophils Relative: 1 %
Eosinophils Absolute: 0.4 10*3/uL (ref 0.0–0.5)
Eosinophils Relative: 3 %
HCT: 45.7 % (ref 39.0–52.0)
Hemoglobin: 14.8 g/dL (ref 13.0–17.0)
Immature Granulocytes: 0 %
Lymphocytes Relative: 15 %
Lymphs Abs: 1.9 10*3/uL (ref 0.7–4.0)
MCH: 29.4 pg (ref 26.0–34.0)
MCHC: 32.4 g/dL (ref 30.0–36.0)
MCV: 90.9 fL (ref 80.0–100.0)
Monocytes Absolute: 1.3 10*3/uL — ABNORMAL HIGH (ref 0.1–1.0)
Monocytes Relative: 10 %
Neutro Abs: 9.3 10*3/uL — ABNORMAL HIGH (ref 1.7–7.7)
Neutrophils Relative %: 71 %
Platelets: 259 10*3/uL (ref 150–400)
RBC: 5.03 MIL/uL (ref 4.22–5.81)
RDW: 13.9 % (ref 11.5–15.5)
WBC: 13 10*3/uL — ABNORMAL HIGH (ref 4.0–10.5)
nRBC: 0 % (ref 0.0–0.2)

## 2019-04-21 LAB — URINALYSIS, ROUTINE W REFLEX MICROSCOPIC
Bilirubin Urine: NEGATIVE
Glucose, UA: NEGATIVE mg/dL
Ketones, ur: NEGATIVE mg/dL
Leukocytes,Ua: NEGATIVE
Nitrite: NEGATIVE
Protein, ur: NEGATIVE mg/dL
Specific Gravity, Urine: 1.015 (ref 1.005–1.030)
pH: 6 (ref 5.0–8.0)

## 2019-04-21 LAB — URINALYSIS, MICROSCOPIC (REFLEX): Squamous Epithelial / LPF: NONE SEEN (ref 0–5)

## 2019-04-21 MED ORDER — IOHEXOL 300 MG/ML  SOLN
100.0000 mL | Freq: Once | INTRAMUSCULAR | Status: AC | PRN
  Administered 2019-04-21: 100 mL via INTRAVENOUS

## 2019-04-21 MED ORDER — CIPROFLOXACIN HCL 500 MG PO TABS
500.0000 mg | ORAL_TABLET | Freq: Once | ORAL | Status: AC
  Administered 2019-04-21: 500 mg via ORAL
  Filled 2019-04-21: qty 1

## 2019-04-21 MED ORDER — METRONIDAZOLE 500 MG PO TABS: 500.0000 mg | ORAL_TABLET | Freq: Three times a day (TID) | ORAL | 0 refills | Status: DC

## 2019-04-21 MED ORDER — METRONIDAZOLE 500 MG PO TABS
500.0000 mg | ORAL_TABLET | Freq: Once | ORAL | Status: AC
  Administered 2019-04-21: 500 mg via ORAL
  Filled 2019-04-21: qty 1

## 2019-04-21 MED ORDER — CIPROFLOXACIN HCL 500 MG PO TABS: 500.0000 mg | ORAL_TABLET | Freq: Two times a day (BID) | ORAL | 0 refills | Status: DC

## 2019-04-21 NOTE — ED Provider Notes (Signed)
[place] EMERGENCY DEPARTMENT Provider Note   CSN: [id number] Arrival date & time: 04/21/19  1325     History   Chief Complaint Chief Complaint  Patient presents with   Abdominal Pain    HPI Steve [name] is a 73 y.o. male.     Patient with history of diverticulitis, no previous abdominal surgeries, last colonoscopy about 10 years ago --presents to the emergency department with 3 days of left lower quadrant abdominal pain that is gradually worsening.  He has had associated nausea but no vomiting.  He has had some constipation and has taken laxatives without any relief.  No urinary symptoms.  No fevers, chest pain, shortness of breath.  Patient states that his last bout of diverticulitis was in January and he was treated with antibiotics.  He gets pain in that area from time to time.  No other treatments prior to arrival.     Past Medical History:  Diagnosis Date   Dementia ([place])    Diverticulitis    Hypercholesteremia    Hypertension    Osteoporosis    Thyroid disease     There are no active problems to display for this patient.   Past Surgical History:  Procedure Laterality Date   NOSE SURGERY     TRANSURETHRAL RESECTION OF PROSTATE          Home Medications    Prior to Admission medications   Medication Sig Start Date End Date Taking? Authorizing Provider  aspirin 325 MG EC tablet Take 325 mg by mouth daily.    [provider]  bismuth subsalicylate (PEPTO BISMOL) 262 MG chewable tablet Chew 524 mg by mouth as needed. For diarrhea    [provider]  cholecalciferol (VITAMIN D) 1000 UNITS tablet Take 1,000 Units by mouth daily.    [provider]  cyanocobalamin 100 MCG tablet Take 100 mcg by mouth daily.    [provider]  esomeprazole (NEXIUM) 40 MG capsule Take 40 mg by mouth daily before breakfast.    [provider]  FENOFIBRATE PO Take 1 tablet by mouth daily.    [provider]   fish oil-omega-3 fatty acids 1000 MG capsule Take 1-2 g by mouth 2 (two) times daily. Take 1 capsule in the morning and 2 capsules in the evening    [provider]  levothyroxine (SYNTHROID, LEVOTHROID) 100 MCG tablet Take 100 mcg by mouth daily.    [provider]  lisinopril (PRINIVIL,ZESTRIL) 10 MG tablet Take 10 mg by mouth daily.    [provider]  loperamide (IMODIUM) 2 MG capsule Take 2 mg by mouth 4 (four) times daily as needed. For diarrhea    [provider]  montelukast (SINGULAIR) 10 MG tablet Take 10 mg by mouth at bedtime.    [provider]  niacin (NIASPAN) 1000 MG CR tablet Take 2,000 mg by mouth at bedtime.    [provider]  simvastatin (ZOCOR) 40 MG tablet Take 40 mg by mouth every evening.    [provider]  Tamsulosin HCl (FLOMAX) 0.4 MG CAPS Take 0.4 mg by mouth daily.    [provider]  vitamin C (ASCORBIC ACID) 500 MG tablet Take 500 mg by mouth daily.    [provider]    Family History No family history on file.  Social History Social History   Tobacco Use   Smoking status: Never Smoker   Smokeless tobacco: Never Used  Substance Use Topics   Alcohol  use: No   Drug use: No     Allergies   Penicillins   Review of Systems Review of Systems  Constitutional: Negative for fever.  HENT: Negative for rhinorrhea and sore throat.   Eyes: Negative for redness.  Respiratory: Negative for cough.   Cardiovascular: Negative for chest pain.  Gastrointestinal: Positive for abdominal pain and nausea. Negative for diarrhea and vomiting.  Genitourinary: Negative for dysuria.  Musculoskeletal: Negative for myalgias.  Skin: Negative for rash.  Neurological: Negative for headaches.     Physical Exam Updated Vital Signs BP 129/83 (BP Location: Left Arm)    Pulse 80    Temp 98.1 F (36.7 C) (Oral)    Resp 20    Ht 5\' 10"  (1.778 m)    Wt (!) 139.7 kg    SpO2 98%    BMI 44.19  kg/m   Physical Exam Vitals signs and nursing note reviewed.  Constitutional:      Appearance: He is well-developed.  HENT:     Head: Normocephalic and atraumatic.  Eyes:     General:        Right eye: No discharge.        Left eye: No discharge.     Conjunctiva/sclera: Conjunctivae normal.  Neck:     Musculoskeletal: Normal range of motion and neck supple.  Cardiovascular:     Rate and Rhythm: Normal rate and regular rhythm.     Heart sounds: Normal heart sounds.  Pulmonary:     Effort: Pulmonary effort is normal.     Breath sounds: Normal breath sounds.  Abdominal:     Palpations: Abdomen is soft.     Tenderness: There is abdominal tenderness (Mild to moderate) in the left lower quadrant. There is no guarding or rebound. Negative signs include Rovsing's sign (Patient does feel pain in the left lower quadrant with palpation in the right lower quadrant).  Skin:    General: Skin is warm and dry.  Neurological:     Mental Status: He is alert.      ED Treatments / Results  Labs (all labs ordered are listed, but only abnormal results are displayed) Labs Reviewed  CBC WITH DIFFERENTIAL/PLATELET - Abnormal; Notable for the following components:      Result Value   WBC 13.0 (*)    Neutro Abs 9.3 (*)    Monocytes Absolute 1.3 (*)    All other components within normal limits  URINALYSIS, ROUTINE W REFLEX MICROSCOPIC - Abnormal; Notable for the following components:   Hgb urine dipstick TRACE (*)    All other components within normal limits  URINALYSIS, MICROSCOPIC (REFLEX) - Abnormal; Notable for the following components:   Bacteria, UA RARE (*)    All other components within normal limits  COMPREHENSIVE METABOLIC PANEL    EKG None  Radiology Ct Abdomen Pelvis W Contrast  Addendum Date: 04/21/2019   ADDENDUM REPORT: 04/21/2019 15:27 ADDENDUM: Additionally there are multiple small less than 5 mm nodules identified in the lung bases bilaterally. No follow-up needed if  patient is low-risk (and has no known or suspected primary neoplasm). Non-contrast chest CT can be considered in 12 months if patient is high-risk. This recommendation follows the consensus statement: Guidelines for Management of Incidental Pulmonary Nodules Detected on CT Images: From the Fleischner Society 2017; Radiology 2017; 284:228-243. Electronically Signed   By: Alcide CleverMark  Lukens M.D.   On: 04/21/2019 15:27   Result Date: 04/21/2019 CLINICAL DATA:  Left-sided abdominal pain for several days EXAM: CT  ABDOMEN AND PELVIS WITH CONTRAST TECHNIQUE: Multidetector CT imaging of the abdomen and pelvis was performed using the standard protocol following bolus administration of intravenous contrast. CONTRAST:  100mL OMNIPAQUE 300 COMPARISON:  None. FINDINGS: Lower chest: Lung bases show no focal infiltrate or sizable effusion. A few small subpleural parenchymal nodules are seen which measure less than 5 mm. Hepatobiliary: No focal liver abnormality is seen. No gallstones, gallbladder wall thickening, or biliary dilatation. Pancreas: Unremarkable. No pancreatic ductal dilatation or surrounding inflammatory changes. Spleen: Normal in size without focal abnormality. Adrenals/Urinary Tract: Adrenal glands are within normal limits. Kidneys are well visualized bilaterally. No renal calculi or obstructive changes are seen. Renal cysts are noted. The bladder is decompressed. Stomach/Bowel: Diverticulosis is identified throughout the colon with focal diverticulitis in the mid descending colon. No abscess or perforation is identified. The more proximal colon is within normal limits. The appendix and small bowel are unremarkable. No gastric abnormality is seen. Vascular/Lymphatic: Atherosclerotic calcifications of the abdominal aorta are noted. There are changes suggestive of a dissection in the distal abdominal aorta extending into the proximal right common iliac artery. Evaluation is limited due to the timing of the contrast bolus  however. No lymphadenopathy is noted. Reproductive: Prostate is unremarkable. Other: No abdominal wall hernia or abnormality. No abdominopelvic ascites. Musculoskeletal: No acute or significant osseous findings. IMPRESSION: Findings consistent with diverticulitis in the descending colon without evidence of abscess or perforation. 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 No aneurysmal dilatation is noted. Nonemergent CTA of the abdomen is recommended for further evaluation. Electronically Signed: By: Alcide CleverMark  Lukens M.D. On: 04/21/2019 14:51    Procedures Procedures (including critical care time)  Medications Ordered in ED Medications  iohexol (OMNIPAQUE) 300 MG/ML solution 100 mL (100 mLs Intravenous Contrast Given 04/21/19 1432)  ciprofloxacin (CIPRO) tablet 500 mg (500 mg Oral Given 04/21/19 1506)  metroNIDAZOLE (FLAGYL) tablet 500 mg (500 mg Oral Given 04/21/19 1506)     Initial Impression / Assessment and Plan / ED Course  I have reviewed the triage vital signs and the nursing notes.  Pertinent labs & imaging results that were available during my care of the patient were reviewed by me and considered in my medical decision making (see chart for details).        Patient seen and examined. Work-up initiated.  Suspect patient with recurrent diverticulitis.  Will check CT to ensure no complicating factors given amount of tenderness.  Labs pending.  Patient declines pain medication at current time.  Vital signs reviewed and are as follows: BP 129/83 (BP Location: Left Arm)    Pulse 80    Temp 98.1 F (36.7 C) (Oral)    Resp 20    Ht 5\' 10"  (1.778 m)    Wt (!) 139.7 kg    SpO2 98%    BMI 44.19 kg/m   3:43 PM patient discussed with and seen by Dr. Pilar PlateBero.   Patient with CT imaging demonstrating diverticulitis.  There are also pulmonary nodules and possible aortic dissection extending into the right common iliac.  I discussed these  findings with Dr. Karle StarchLukens by telephone.  Unable to tell if this is a chronic or acute dissection based on timing of IV dye load.  However there is no aneurysmal dilatation and no indications for emergent CT imaging today.  He does not feel emergent vascular consult needed.   I agree with these recommendations.  Patient clinically has exam consistent with  diverticulitis.  He has not had any lower extremity pain or symptoms consistent with vascular obstruction. He has not had any severe flank, back, or abdominal pain prior to this.  Dissection is likely not acute today, however I stressed with patient the importance to return immediately if he develops any of the above symptoms and to follow-up with his primary care doctor.  He will likely require CT angiography for further evaluation and delineation.  Patient and wife both verbalized understanding and are willing to follow-up with their doctor regarding these results.  Final Clinical Impressions(s) / ED Diagnoses   Final diagnoses:  Diverticulitis  Abnormal CT scan   Left lower quadrant pain consistent with diverticulitis.  CT scan confirms this.  There are no complicating factors of diverticulitis to necessitate inpatient admission at this time.  Patient is tolerating antibiotics prior to discharge.  Incidental findings on CT scan: Discussion as above.  Patient can follow-up with his primary care doctor for further evaluation.  ED Discharge Orders         Ordered    ciprofloxacin (CIPRO) 500 MG tablet  2 times daily     04/21/19 1538    metroNIDAZOLE (FLAGYL) 500 MG tablet  3 times daily     04/21/19 1538           Renne CriglerGeiple, Kolton Kienle, PA-C 04/21/19 1548    Sabas SousBero, Michael M, MD 04/23/19 [phone]

## 2019-04-21 NOTE — ED Triage Notes (Signed)
Pt c/o left side abd pain,nausea x 3 days-denies v/d-NAD-steady gait

## 2019-04-21 NOTE — ED Notes (Signed)
Patient transported to CT 

## 2019-04-21 NOTE — Discharge Instructions (Signed)
Please read and follow all provided instructions.  Your diagnoses today include:  1. Diverticulitis   2. Abnormal CT scan     Tests performed today include:  Blood counts and electrolytes  Blood tests to check liver and kidney function  Urine test to look for infection  CT scan -shows diverticulitis however it also shows a possible tear in the aorta into the right groin.  This is a finding which will likely require another CT scan to fully check out.  We feel that this can be checked on by your primary care doctor and you should let them know about it so they can follow-up as soon as possible.  Vital signs. See below for your results today.   Medications prescribed:   Ciprofloxacin - antibiotic  You have been prescribed an antibiotic medicine: take the entire course of medicine even if you are feeling better. Stopping early can cause the antibiotic not to work.   Metronidazole - antibiotic  You have been prescribed an antibiotic medicine: take the entire course of medicine even if you are feeling better. Stopping early can cause the antibiotic not to work. Do not drink alcohol when taking this medication.   Take any prescribed medications only as directed.  Home care instructions:   Follow any educational materials contained in this packet.  Follow-up instructions: Please follow-up with your primary care provider in the next 2 days for further evaluation of your symptoms and further work-up of your abnormal CT.     Return instructions:  SEEK IMMEDIATE MEDICAL ATTENTION IF:  You develop pain, coolness to touch, or pallor in the right leg or you have severe abdominal pain in your flank, lower back.  The pain does not go away or becomes severe   A temperature above 101F develops   Repeated vomiting occurs (multiple episodes)   The pain becomes localized to portions of the abdomen. The right side could possibly be appendicitis. In an adult, the left lower portion of the  abdomen could be colitis or diverticulitis.   Blood is being passed in stools or vomit (bright red or black tarry stools)   You develop chest pain, difficulty breathing, dizziness or fainting, or become confused, poorly responsive, or inconsolable (young children)  If you have any other emergent concerns regarding your health  Additional Information: Abdominal (belly) pain can be caused by many things. Your caregiver performed an examination and possibly ordered blood/urine tests and imaging (CT scan, x-rays, ultrasound). Many cases can be observed and treated at home after initial evaluation in the emergency department. Even though you are being discharged home, abdominal pain can be unpredictable. Therefore, you need a repeated exam if your pain does not resolve, returns, or worsens. Most patients with abdominal pain don't have to be admitted to the hospital or have surgery, but serious problems like appendicitis and gallbladder attacks can start out as nonspecific pain. Many abdominal conditions cannot be diagnosed in one visit, so follow-up evaluations are very important.  Your vital signs today were: BP 123/65    Pulse 76    Temp 98.1 F (36.7 C) (Oral)    Resp 17    Ht 5\' 10"  (1.778 m)    Wt (!) 139.7 kg    SpO2 94%    BMI 44.19 kg/m  If your blood pressure (bp) was elevated above 135/85 this visit, please have this repeated by your doctor within one month. --------------

## 2019-04-21 NOTE — ED Notes (Signed)
ED Provider at bedside. 

## 2019-04-21 NOTE — ED Notes (Signed)
Pt given urinal to obtain urine sample.  

## 2019-06-01 ENCOUNTER — Encounter: Payer: Self-pay | Admitting: Vascular Surgery

## 2019-06-01 ENCOUNTER — Ambulatory Visit (INDEPENDENT_AMBULATORY_CARE_PROVIDER_SITE_OTHER): Payer: Medicare Other | Admitting: Vascular Surgery

## 2019-06-01 ENCOUNTER — Other Ambulatory Visit: Payer: Self-pay

## 2019-06-01 VITALS — BP 115/79 | HR 77 | Temp 97.3°F | Resp 20 | Ht 70.0 in | Wt 304.5 lb

## 2019-06-01 DIAGNOSIS — I7102 Dissection of abdominal aorta: Secondary | ICD-10-CM | POA: Diagnosis not present

## 2019-06-01 NOTE — Progress Notes (Signed)
[facility]  Patient name: Steve [name] MRN: [id number] DOB: [birth year] Sex: male  REASON FOR CONSULT: Discuss finding of aortic dissection  HPI: Steve [name] is a 73 y.o. male, who is here today for discussion of incidental finding of infrarenal aortic dissection.  He is here today with his wife.  He had had abdominal pain and presented to emergency department where he underwent CT scan.  This revealed diverticulitis as the etiology for his pain.  He also had incidental finding of infrarenal aortic dissection.  He is here today for further discussion of this.  Date of the CT scan was 05/06/2019 he has no symptoms of lower extremity ischemia and no symptoms of renal insufficiency.  He is morbidly obese.  Does have a history of hypertension  Past Medical History:  Diagnosis Date  . Dementia ([facility])   . Diverticulitis   . Hypercholesteremia   . Hypertension   . Osteoporosis   . Thyroid disease     History reviewed. No pertinent family history.  SOCIAL HISTORY: Social History   Socioeconomic History  . Marital status: Married    Spouse name: Not on file  . Number of children: Not on file  . Years of education: Not on file  . Highest education level: Not on file  Occupational History  . Not on file  Social Needs  . Financial resource strain: Not on file  . Food insecurity    Worry: Not on file    Inability: Not on file  . Transportation needs    Medical: Not on file    Non-medical: Not on file  Tobacco Use  . Smoking status: Never Smoker  . Smokeless tobacco: Never Used  Substance and Sexual Activity  . Alcohol use: No  . Drug use: No  . Sexual activity: Not on file  Lifestyle  . Physical activity    Days per week: Not on file    Minutes per session: Not on file  . Stress: Not on file  Relationships  . Social Musicianconnections    Talks on phone: Not on file    Gets together: Not on file    Attends  religious service: Not on file    Active member of club or organization: Not on file    Attends meetings of clubs or organizations: Not on file    Relationship status: Not on file  . Intimate partner violence    Fear of current or ex partner: Not on file    Emotionally abused: Not on file    Physically abused: Not on file    Forced sexual activity: Not on file  Other Topics Concern  . Not on file  Social History Narrative  . Not on file    Allergies  Allergen Reactions  . Donepezil Diarrhea and Other (See Comments)    Abdominal pain Diarrhea Abdominal pain Diarrhea   . Penicillins Rash    Current Outpatient Medications  Medication Sig Dispense Refill  . aspirin 325 MG EC tablet Take 325 mg by mouth daily.    . cholecalciferol (VITAMIN D) 1000 UNITS tablet Take 1,000 Units by mouth daily.    . clorazepate (TRANXENE) 7.5 MG tablet TK 1 T PO BID    . cyanocobalamin 100 MCG tablet Take 100 mcg by mouth daily.    Marland Kitchen. denosumab (PROLIA) 60 MG/ML SOSY injection Inject into the skin.    . fish oil-omega-3 fatty acids 1000 MG capsule Take 1-2 g by mouth  2 (two) times daily. Take 1 capsule in the morning and 2 capsules in the evening    . levothyroxine (SYNTHROID) 125 MCG tablet     . lisinopril (PRINIVIL,ZESTRIL) 10 MG tablet Take 10 mg by mouth daily.    . memantine (NAMENDA) 10 MG tablet     . montelukast (SINGULAIR) 10 MG tablet Take 10 mg by mouth at bedtime.    Marland Kitchen. MYRBETRIQ 50 MG TB24 tablet     . pantoprazole (PROTONIX) 40 MG tablet     . simvastatin (ZOCOR) 40 MG tablet Take 40 mg by mouth every evening.    . vitamin C (ASCORBIC ACID) 500 MG tablet Take 500 mg by mouth daily.     No current facility-administered medications for this visit.     REVIEW OF SYSTEMS:  [X]  denotes positive finding, [ ]  denotes negative finding Cardiac  Comments:  Chest pain or chest pressure:    Shortness of breath upon exertion:    Short of breath when lying flat:    Irregular heart rhythm:         Vascular    Pain in calf, thigh, or hip brought on by ambulation:    Pain in feet at night that wakes you up from your sleep:     Blood clot in your veins:    Leg swelling:         Pulmonary    Oxygen at home:    Productive cough:     Wheezing:         Neurologic    Sudden weakness in arms or legs:     Sudden numbness in arms or legs:     Sudden onset of difficulty speaking or slurred speech:    Temporary loss of vision in one eye:     Problems with dizziness:  x       Gastrointestinal    Blood in stool:  x   Vomited blood:         Genitourinary    Burning when urinating:     Blood in urine:        Psychiatric    Major depression:         Hematologic    Bleeding problems:    Problems with blood clotting too easily:        Skin    Rashes or ulcers:        Constitutional    Fever or chills:      PHYSICAL EXAM: Vitals:   06/01/19 1251  BP: 115/79  Pulse: 77  Resp: 20  Temp: (!) 97.3 F (36.3 C)  SpO2: 95%  Weight: (!) 138.1 kg  Height: 5\' 10"  (1.778 m)    GENERAL: The patient is a well-nourished male, in no acute distress. The vital signs are documented above. CARDIOVASCULAR: 2+ radial pulses bilaterally.  No evidence of carotid bruits bilaterally.  2+ popliteal and 2+ dorsalis pedis pulses bilaterally PULMONARY: There is good air exchange  ABDOMEN: Soft and non-tender  MUSCULOSKELETAL: There are no major deformities or cyanosis. NEUROLOGIC: No focal weakness or paresthesias are detected. SKIN: There are no ulcers or rashes noted. PSYCHIATRIC: The patient has a normal affect.  DATA:  CT scan was reviewed and discussed with the patient and his wife.  He does have dissection just below the renal artery takeoff and extending into the right common iliac artery.  Also has dissection of the left common iliac artery.  There is moderate dilatation of the right common iliac  artery.  MEDICAL ISSUES: Had long discussion with the patient and his wife  regarding this incidental finding.  I have recommended Pete CT scan for follow-up in 1 year.  Explained potential progression of dilatation in his dissected segments.  Also explained the possible but unlikely event of occlusion of his iliac vessels.  Also discussed symptoms of rupture of his aorta iliac segments which would also be quite unlikely.  He knows to report immediately to the emergency room should this occur.  We will see him again in 1 year with repeat CT scan   Rosetta Posner, MD [facility] of [facility] Tel [phone] Pager [phone]

## 2019-12-11 ENCOUNTER — Ambulatory Visit: Payer: Medicare Other | Attending: Internal Medicine

## 2019-12-11 DIAGNOSIS — Z23 Encounter for immunization: Secondary | ICD-10-CM

## 2019-12-11 NOTE — Progress Notes (Signed)
   Covid-19 Vaccination Clinic  Name:  Steve [name]    MRN: [id number] DOB: [birth year]  12/11/2019  Steve [name] was observed post Covid-19 immunization for 15 minutes without incidence. He was provided with Vaccine Information Sheet and instruction to access the V-Safe system.   Steve [name] was instructed to call 911 with any severe reactions post vaccine: Marland Kitchen Difficulty breathing  . Swelling of your face and throat  . A fast heartbeat  . A bad rash all over your body  . Dizziness and weakness    Immunizations Administered    Name Date Dose VIS Date Route   Pfizer COVID-19 Vaccine 12/11/2019  1:58 PM 0.3 mL 10/01/2019 Intramuscular   Manufacturer: [employer], [employer]   Lot: [id number]   NDC: [address]-1

## 2020-01-04 ENCOUNTER — Ambulatory Visit: Payer: Medicare Other | Attending: Internal Medicine

## 2020-01-04 DIAGNOSIS — Z23 Encounter for immunization: Secondary | ICD-10-CM

## 2020-01-04 NOTE — Progress Notes (Signed)
   Covid-19 Vaccination Clinic  Name:  Yacoub Diltz    MRN: [id number] DOB: [birth year]  01/04/2020  Mr. Economou was observed post Covid-19 immunization for 15 minutes without incident. He was provided with Vaccine Information Sheet and instruction to access the V-Safe system.   Mr. Tinnel was instructed to call 911 with any severe reactions post vaccine: Marland Kitchen Difficulty breathing  . Swelling of face and throat  . A fast heartbeat  . A bad rash all over body  . Dizziness and weakness   Immunizations Administered    Name Date Dose VIS Date Route   Pfizer COVID-19 Vaccine 01/04/2020  1:32 PM 0.3 mL 10/01/2019 Intramuscular   Manufacturer: [employer], [employer]   Lot: [id number]   NDC: [address]-1

## 2020-02-28 DIAGNOSIS — H9193 Unspecified hearing loss, bilateral: Secondary | ICD-10-CM | POA: Insufficient documentation

## 2020-02-28 DIAGNOSIS — R42 Dizziness and giddiness: Secondary | ICD-10-CM | POA: Insufficient documentation

## 2020-03-13 ENCOUNTER — Encounter: Payer: Self-pay | Admitting: Orthopedic Surgery

## 2020-03-13 ENCOUNTER — Ambulatory Visit: Payer: Medicare Other | Admitting: Orthopedic Surgery

## 2020-03-13 ENCOUNTER — Other Ambulatory Visit: Payer: Self-pay

## 2020-03-13 VITALS — Ht 70.0 in | Wt 304.0 lb

## 2020-03-13 DIAGNOSIS — M25531 Pain in right wrist: Secondary | ICD-10-CM | POA: Diagnosis not present

## 2020-03-13 DIAGNOSIS — M25532 Pain in left wrist: Secondary | ICD-10-CM

## 2020-03-13 NOTE — Progress Notes (Signed)
Office Visit Note   Patient: Steve [name]           Date of Birth: Aug 10, 1946           MRN: [id number] Visit Date: 03/13/2020              Requested by: Drake Leach, MD [address] [place],  [place] [address] PCP: Drake Leach, MD  Chief Complaint  Patient presents with  . Left Wrist - Pain  . Right Wrist - Pain      HPI: Patient is a 74 year old gentleman who presents complaining of bilateral wrist pain over the past 6 months he is right-hand dominant.  He states he has pain primarily over the dorsum of the wrist denies any numbness into his fingers denies any carpal tunnel symptoms in the morning.  He states occasionally he cannot open a jar and sometimes he can.  Assessment & Plan: Visit Diagnoses:  1. Bilateral wrist pain     Plan: Patient was given instructions to use the Voltaren gel he states that this does help he could use CBD lotion as well also recommended paraffin baths and if he is doing any strenuous work to use a Velcro wrist splint.  Follow-Up Instructions: Return if symptoms worsen or fail to improve.   Ortho Exam  Patient is alert, oriented, no adenopathy, well-dressed, normal affect, normal respiratory effort. Examination patient has a negative Phalen's negative Tinel's test.  The base of the carpometacarpal joint of both thumbs is more painful on the right than the left but minimally painful both wrists have no pain to palpation over the scaphoid they are tender to palpation of the scapholunate and the TFCC is nontender to palpation.  The first dorsal extensor compartment is nontender to palpation.  Imaging: No results found. No images are attached to the encounter.  Labs: No results found for: HGBA1C, ESRSEDRATE, CRP, LABURIC, REPTSTATUS, GRAMSTAIN, CULT, LABORGA   Lab Results  Component Value Date   ALBUMIN 4.2 04/21/2019   ALBUMIN 4.1 12/22/2011    No results found for: MG No results found for: VD25OH  No results found for:  PREALBUMIN CBC EXTENDED Latest Ref Rng & Units 04/21/2019 03/30/2014 12/22/2011  WBC 4.0 - 10.5 K/uL 13.0(H) 7.7 11.7(H)  RBC 4.22 - 5.81 MIL/uL 5.03 4.50 4.91  HGB 13.0 - 17.0 g/dL 14.8 14.1 14.7  HCT 39.0 - 52.0 % 45.7 40.4 42.3  PLT 150 - 400 K/uL 259 206 237  NEUTROABS 1.7 - 7.7 K/uL 9.3(H) - 10.1(H)  LYMPHSABS 0.7 - 4.0 K/uL 1.9 - 0.9     Body mass index is 43.62 kg/m.  Orders:  No orders of the defined types were placed in this encounter.  No orders of the defined types were placed in this encounter.    Procedures: No procedures performed  Clinical Data: No additional findings.  ROS:  All other systems negative, except as noted in the HPI. Review of Systems  Objective: Vital Signs: Ht 5\' 10"  (1.778 m)   Wt (!) 304 lb (137.9 kg)   BMI 43.62 kg/m   Specialty Comments:  No specialty comments available.  PMFS History: There are no problems to display for this patient.  Past Medical History:  Diagnosis Date  . Dementia ([facility])   . Diverticulitis   . Hypercholesteremia   . Hypertension   . Osteoporosis   . Thyroid disease     History reviewed. No pertinent family history.  Past Surgical History:  Procedure Laterality Date  .  NOSE SURGERY    . TRANSURETHRAL RESECTION OF PROSTATE     Social History   Occupational History  . Not on file  Tobacco Use  . Smoking status: Never Smoker  . Smokeless tobacco: Never Used  Substance and Sexual Activity  . Alcohol use: No  . Drug use: No  . Sexual activity: Not on file

## 2020-05-08 ENCOUNTER — Other Ambulatory Visit: Payer: Self-pay

## 2020-05-08 DIAGNOSIS — I7102 Dissection of abdominal aorta: Secondary | ICD-10-CM

## 2020-05-30 ENCOUNTER — Ambulatory Visit: Payer: Medicare Other | Admitting: Vascular Surgery

## 2020-08-09 ENCOUNTER — Other Ambulatory Visit: Payer: Self-pay

## 2020-08-09 ENCOUNTER — Ambulatory Visit
Admission: RE | Admit: 2020-08-09 | Discharge: 2020-08-09 | Disposition: A | Discharge status: Home / Self Care | Payer: Medicare Other | Source: Ambulatory Visit | Attending: Vascular Surgery | Admitting: Vascular Surgery

## 2020-08-09 DIAGNOSIS — I7102 Dissection of abdominal aorta: Secondary | ICD-10-CM

## 2020-08-09 MED ORDER — IOPAMIDOL (ISOVUE-370) INJECTION 76%
75.0000 mL | Freq: Once | INTRAVENOUS | Status: AC | PRN
  Administered 2020-08-09: 75 mL via INTRAVENOUS

## 2020-08-15 ENCOUNTER — Encounter: Payer: Self-pay | Admitting: Vascular Surgery

## 2020-08-15 ENCOUNTER — Other Ambulatory Visit: Payer: Self-pay

## 2020-08-15 ENCOUNTER — Ambulatory Visit: Payer: Medicare Other | Admitting: Vascular Surgery

## 2020-08-15 DIAGNOSIS — I7100 Dissection of unspecified site of aorta: Secondary | ICD-10-CM | POA: Insufficient documentation

## 2020-08-15 DIAGNOSIS — I7102 Dissection of abdominal aorta: Secondary | ICD-10-CM | POA: Diagnosis not present

## 2020-08-15 NOTE — Progress Notes (Signed)
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 male with hx HTN that presents for 1 year follow-up of an aortic dissection previously followed by Dr. Arbie Cookey.  Reportedly had an incidental finding of an infrarenal aortic dissection when he last saw Dr. Arbie Cookey on 06/01/2019.  At the time of evaluation apparently had diverticulitis with abdominal pain and there was also an incidental finding of an infrarenal aortic dissection.  CT was done on 05/06/2019 last year with initial findings and on initial evaluation by Dr. Arbie Cookey he had no symptoms of lower extremity ischemia or other malperfusion.  Over the past year no significant changes in his health.  He has gained weight.  No abdominal or back pain.  Past Medical History:  Diagnosis Date  . Dementia ([facility])   . Diverticulitis   . Hypercholesteremia   . Hypertension   . Osteoporosis   . Thyroid disease     Past Surgical History:  Procedure Laterality Date  . NOSE SURGERY    . TRANSURETHRAL RESECTION OF PROSTATE      No family history on file.  SOCIAL HISTORY: Social History   Tobacco Use  . Smoking status: Never Smoker  . Smokeless tobacco: Never Used  Substance Use Topics  . Alcohol use: No    Allergies  Allergen Reactions  . Donepezil Diarrhea and Other (See Comments)    Abdominal pain Diarrhea Abdominal pain Diarrhea   . Penicillins Rash    Current Outpatient Medications  Medication Sig Dispense Refill  . aspirin 325 MG EC tablet Take 325 mg by mouth daily.    . cholecalciferol (VITAMIN D) 1000 UNITS tablet Take 1,000 Units by mouth daily.    . clorazepate (TRANXENE) 7.5 MG tablet TK 1 T PO BID    . cyanocobalamin 100 MCG tablet Take 100 mcg by mouth daily.    Marland Kitchen denosumab (PROLIA) 60 MG/ML SOSY injection Inject into the skin.    . fish oil-omega-3 fatty acids 1000 MG capsule Take 1-2 g by mouth  2 (two) times daily. Take 1 capsule in the morning and 2 capsules in the evening    . levothyroxine (SYNTHROID) 125 MCG tablet     . lisinopril (PRINIVIL,ZESTRIL) 10 MG tablet Take 10 mg by mouth daily.    . memantine (NAMENDA) 10 MG tablet     . montelukast (SINGULAIR) 10 MG tablet Take 10 mg by mouth at bedtime.    Marland Kitchen MYRBETRIQ 50 MG TB24 tablet     . pantoprazole (PROTONIX) 40 MG tablet     . simvastatin (ZOCOR) 40 MG tablet Take 40 mg by mouth every evening.    . vitamin C (ASCORBIC ACID) 500 MG tablet Take 500 mg by mouth daily.     No current facility-administered medications for this visit.    REVIEW OF SYSTEMS:  [X]  denotes positive finding, [ ]  denotes negative finding Cardiac  Comments:  Chest pain or chest pressure:    Shortness of breath upon exertion:    Short of breath when lying flat:    Irregular heart rhythm:        Vascular    Pain in calf, thigh, or hip brought on by ambulation:    Pain in feet at night that wakes you up from your sleep:     Blood clot in your veins:    Leg swelling:  Pulmonary    Oxygen at home:    Productive cough:     Wheezing:         Neurologic    Sudden weakness in arms or legs:     Sudden numbness in arms or legs:     Sudden onset of difficulty speaking or slurred speech:    Temporary loss of vision in one eye:     Problems with dizziness:         Gastrointestinal    Blood in stool:     Vomited blood:         Genitourinary    Burning when urinating:     Blood in urine:        Psychiatric    Major depression:         Hematologic    Bleeding problems:    Problems with blood clotting too easily:        Skin    Rashes or ulcers:        Constitutional    Fever or chills:      PHYSICAL EXAM: There were no vitals filed for this visit.  GENERAL: The patient is a well-nourished male, in no acute distress. The vital signs are documented above. CARDIAC: There is a regular rate and rhythm.  VASCULAR: Palpable  femoral pulses bilaterally Palpable dorsalis pedis pulses bilaterally PULMONARY: No respiratory distress ABDOMEN: Soft and non-tender.  No significant pain. MUSCULOSKELETAL: There are no major deformities or cyanosis. NEUROLOGIC: No focal weakness or paresthesias are detected.   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  Difficult to visualize any dissection into the left iliac on the CT.  This looks stable on my review from CT one year ago.  The infrarenal aorta is greatest diameter is 3 cm.  Right common iliac measures just over 3 cm which is stable from last year CT.  Assessment/Plan:  74 year old male presents for 1 year follow-up for a incidental dissection of the infrarenal abdominal aorta initially evaluated by Dr. Arbie Cookey last year.  Repeat CTA shows stable infrarenal dissection of the abdominal aorta with extension into the right common iliac.  He has no signs of malperfusion and has palpable pedal pulses in the foot.  Discussed given no significant change would recommend follow-up again in 1 year with CTA chest abdomen pelvis.  We discussed long-term risk being continued degeneration of either his infrarenal aorta or iliacs resulting in aneurysmal disease ultimately requiring treatment.  Fortunately no changes over the last year which is good news and again it looks stable.  He knows to call with questions or concerns.   Cephus Shelling, MD [facility] Office: [phone]

## 2021-01-04 DIAGNOSIS — H43823 Vitreomacular adhesion, bilateral: Secondary | ICD-10-CM | POA: Insufficient documentation

## 2021-01-04 DIAGNOSIS — H35342 Macular cyst, hole, or pseudohole, left eye: Secondary | ICD-10-CM | POA: Insufficient documentation

## 2021-01-31 ENCOUNTER — Ambulatory Visit (HOSPITAL_COMMUNITY): Payer: Medicare Other

## 2021-02-06 ENCOUNTER — Other Ambulatory Visit (HOSPITAL_COMMUNITY): Payer: Self-pay

## 2021-02-07 ENCOUNTER — Ambulatory Visit (HOSPITAL_COMMUNITY)
Admission: RE | Admit: 2021-02-07 | Discharge: 2021-02-07 | Disposition: A | Discharge status: Home / Self Care | Payer: Medicare Other | Source: Ambulatory Visit | Attending: Internal Medicine | Admitting: Internal Medicine

## 2021-02-07 ENCOUNTER — Other Ambulatory Visit: Payer: Self-pay

## 2021-02-07 DIAGNOSIS — M810 Age-related osteoporosis without current pathological fracture: Secondary | ICD-10-CM | POA: Insufficient documentation

## 2021-02-07 MED ORDER — ZOLEDRONIC ACID 5 MG/100ML IV SOLN
INTRAVENOUS | Status: AC
  Administered 2021-02-07: 5 mg via INTRAVENOUS
  Filled 2021-02-07: qty 100

## 2021-02-07 MED ORDER — ZOLEDRONIC ACID 5 MG/100ML IV SOLN: 5.0000 mg | Freq: Once | INTRAVENOUS | Status: AC

## 2021-05-18 IMAGING — CT CT ABDOMEN AND PELVIS WITH CONTRAST
2 of 5 series · 14 of 46 positions shown, 16 images · IV contrast (APPLIED)
Comparison: None.
COMPARISON: None.

Addendum:
CLINICAL DATA: Left-sided abdominal pain for several days

EXAM:
CT ABDOMEN AND PELVIS WITH CONTRAST
TECHNIQUE: Multidetector CT imaging of the abdomen and pelvis was performed
using the standard protocol following bolus administration of
intravenous contrast.
CONTRAST:  100mL OMNIPAQUE 300

[Series 2: axial st · axial · 0.98mm/px · z∈[-541,-61]mm · 11 of 109 slices shown, 13 images]
[im 7/109  soft-tissue]
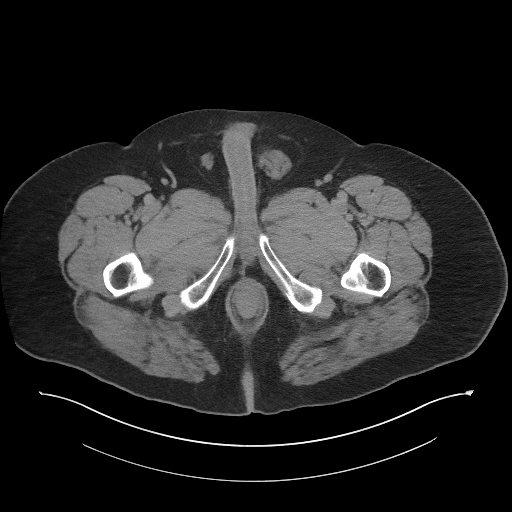
[im 7/109  bone]
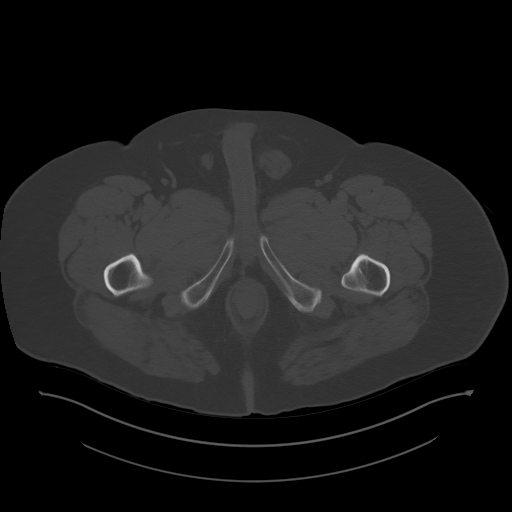
[im 19/109  soft-tissue]
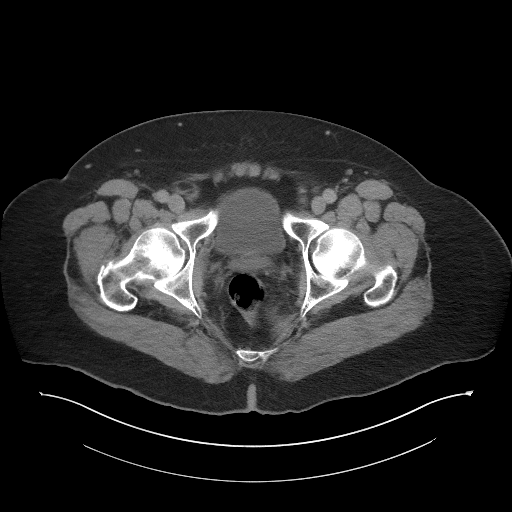
[im 25/109  soft-tissue]
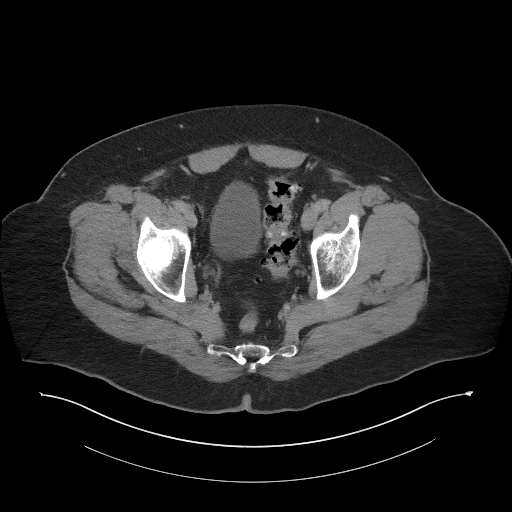
[im 37/109  soft-tissue]
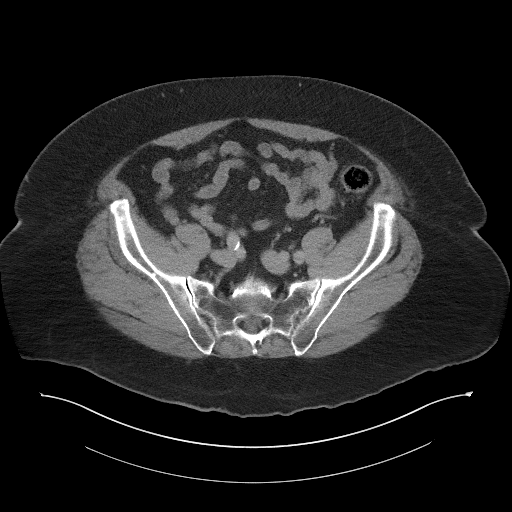
[im 43/109  soft-tissue]
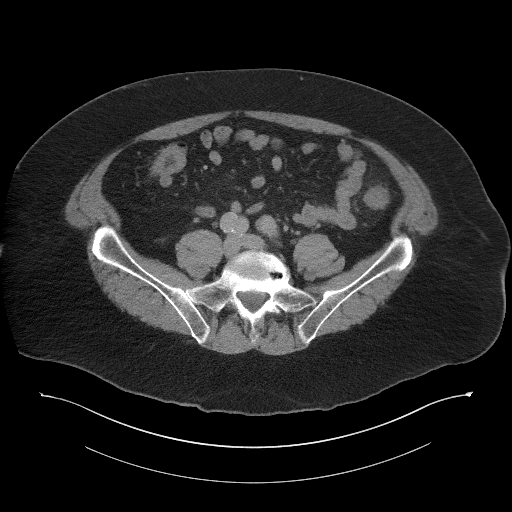
[im 55/109  soft-tissue]
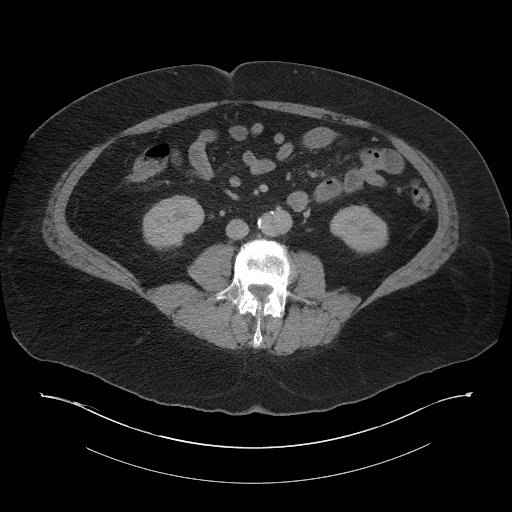
[im 67/109  soft-tissue]
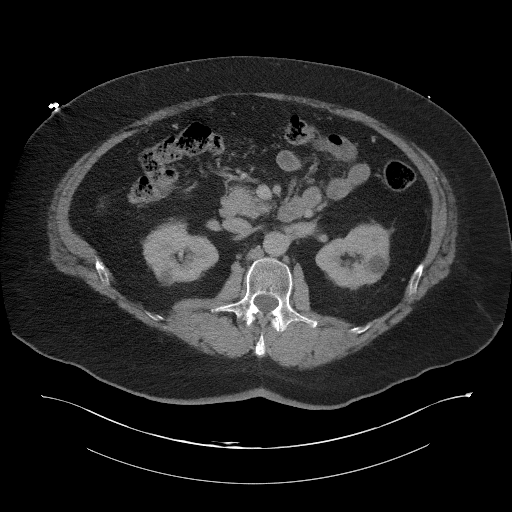
[im 73/109  soft-tissue]
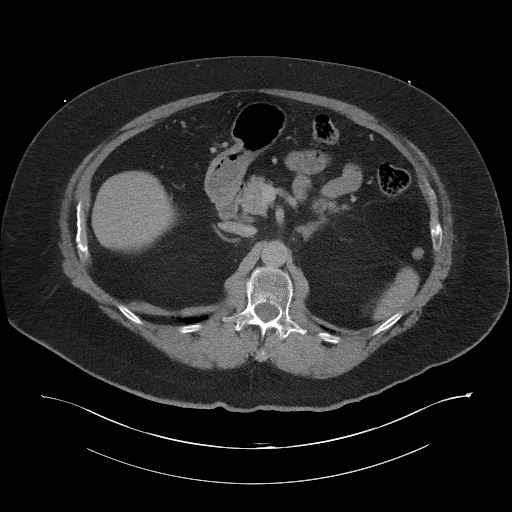
[im 85/109  soft-tissue]
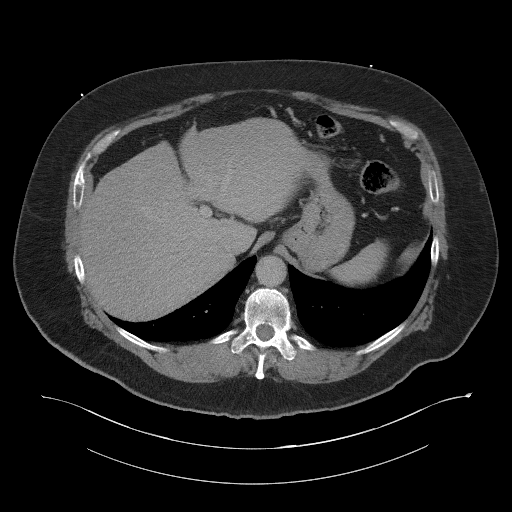
[im 85/109  bone]
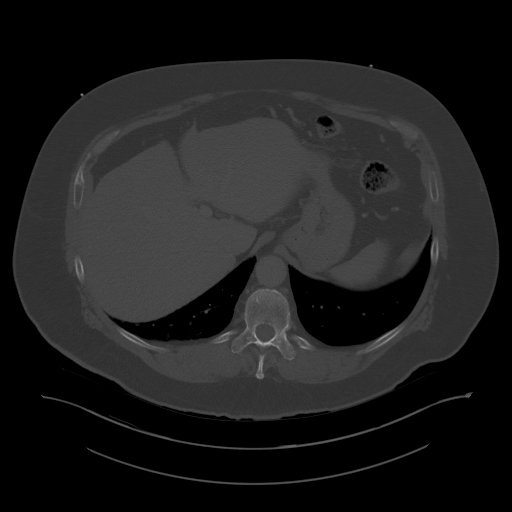
[im 91/109  soft-tissue]
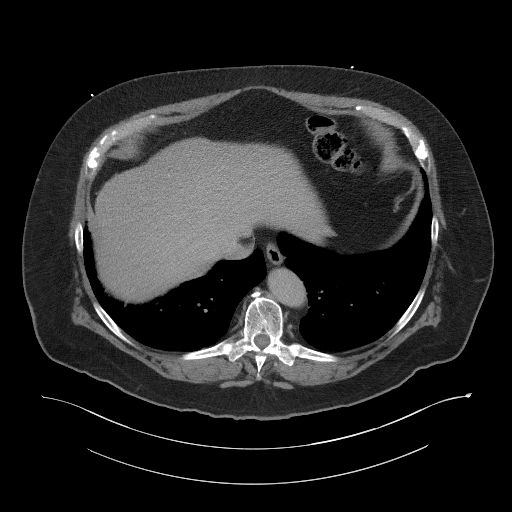
[im 103/109  soft-tissue]
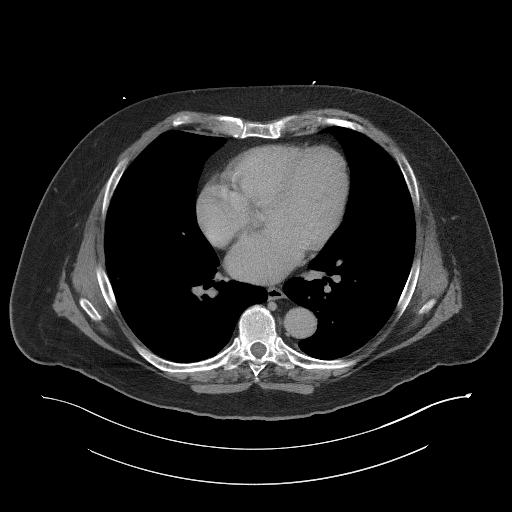

[Series 5: coronal st · coronal · 1.05mm/px · 3 of 116 slices shown]
[im 39/116  soft-tissue]
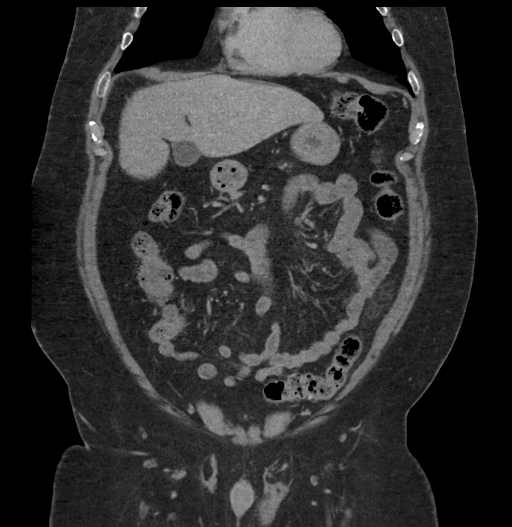
[im 52/116  soft-tissue]
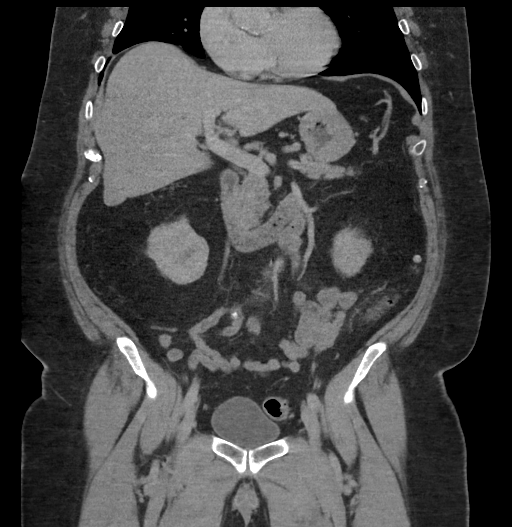
[im 64/116  soft-tissue]
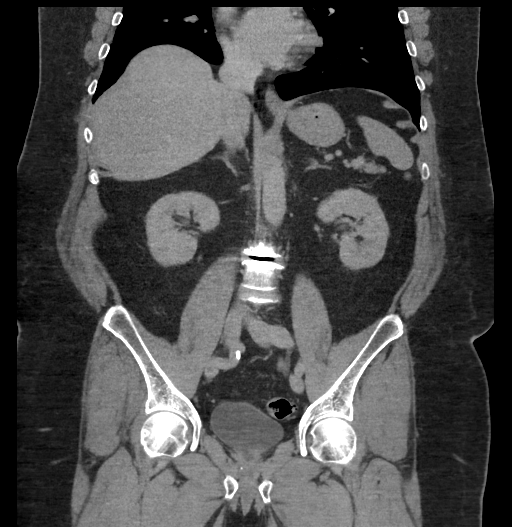

[14 of 46 positions shown; findings below may reference images not displayed]

FINDINGS: Lower chest: Lung bases show no focal infiltrate or sizable
effusion. A few small subpleural parenchymal nodules are seen which
measure less than 5 mm.

Hepatobiliary: No focal liver abnormality is seen. No gallstones,
gallbladder wall thickening, or biliary dilatation.

Pancreas: Unremarkable. No pancreatic ductal dilatation or
surrounding inflammatory changes.

Spleen: Normal in size without focal abnormality.

Adrenals/Urinary Tract: Adrenal glands are within normal limits.
Kidneys are well visualized bilaterally. No renal calculi or
obstructive changes are seen. Renal cysts are noted. The bladder is
decompressed.

Stomach/Bowel: Diverticulosis is identified throughout the colon
with focal diverticulitis in the mid descending colon. No abscess or
perforation is identified. The more proximal colon is within normal
limits. The appendix and small bowel are unremarkable. No gastric
abnormality is seen.

Vascular/Lymphatic: Atherosclerotic calcifications of the abdominal
aorta are noted. There are changes suggestive of a dissection in the
distal abdominal aorta extending into the proximal right common
iliac artery. Evaluation is limited due to the timing of the
contrast bolus however. No lymphadenopathy is noted.

Reproductive: Prostate is unremarkable.

Other: No abdominal wall hernia or abnormality. No abdominopelvic
ascites.

Musculoskeletal: No acute or significant osseous findings.
IMPRESSION: Findings consistent with diverticulitis in the descending colon
without evidence of abscess or perforation.

Findings suggestive of a dissection in the distal aorta which is
incompletely evaluated on this exam. No aneurysmal dilatation is
noted. Nonemergent CTA of the abdomen is recommended for further
evaluation.

ADDENDUM:
Additionally there are multiple small less than 5 mm nodules
identified in the lung bases bilaterally. No follow-up needed if
patient is low-risk (and has no known or suspected primary
neoplasm). Non-contrast chest CT can be considered in 12 months if
patient is high-risk. This recommendation follows the consensus
statement: Guidelines for Management of Incidental Pulmonary Nodules
Detected on CT Images: From the [HOSPITAL] 6354; Radiology
6354; [DATE].

*** End of Addendum ***
FINDINGS: Lower chest: Lung bases show no focal infiltrate or sizable
effusion. A few small subpleural parenchymal nodules are seen which
measure less than 5 mm.

Hepatobiliary: No focal liver abnormality is seen. No gallstones,
gallbladder wall thickening, or biliary dilatation.

Pancreas: Unremarkable. No pancreatic ductal dilatation or
surrounding inflammatory changes.

Spleen: Normal in size without focal abnormality.

Adrenals/Urinary Tract: Adrenal glands are within normal limits.
Kidneys are well visualized bilaterally. No renal calculi or
obstructive changes are seen. Renal cysts are noted. The bladder is
decompressed.

Stomach/Bowel: Diverticulosis is identified throughout the colon
with focal diverticulitis in the mid descending colon. No abscess or
perforation is identified. The more proximal colon is within normal
limits. The appendix and small bowel are unremarkable. No gastric
abnormality is seen.

Vascular/Lymphatic: Atherosclerotic calcifications of the abdominal
aorta are noted. There are changes suggestive of a dissection in the
distal abdominal aorta extending into the proximal right common
iliac artery. Evaluation is limited due to the timing of the
contrast bolus however. No lymphadenopathy is noted.

Reproductive: Prostate is unremarkable.

Other: No abdominal wall hernia or abnormality. No abdominopelvic
ascites.

Musculoskeletal: No acute or significant osseous findings.
IMPRESSION: Findings consistent with diverticulitis in the descending colon
without evidence of abscess or perforation.

Findings suggestive of a dissection in the distal aorta which is
incompletely evaluated on this exam. No aneurysmal dilatation is
noted. Nonemergent CTA of the abdomen is recommended for further
evaluation.

## 2021-07-04 ENCOUNTER — Other Ambulatory Visit: Payer: Self-pay | Admitting: *Deleted

## 2021-07-04 DIAGNOSIS — I7103 Dissection of thoracoabdominal aorta: Secondary | ICD-10-CM

## 2021-07-16 DIAGNOSIS — H43811 Vitreous degeneration, right eye: Secondary | ICD-10-CM | POA: Insufficient documentation

## 2021-08-07 ENCOUNTER — Encounter (HOSPITAL_BASED_OUTPATIENT_CLINIC_OR_DEPARTMENT_OTHER): Payer: Self-pay

## 2021-08-07 ENCOUNTER — Ambulatory Visit (HOSPITAL_BASED_OUTPATIENT_CLINIC_OR_DEPARTMENT_OTHER)
Admission: RE | Admit: 2021-08-07 | Discharge: 2021-08-07 | Disposition: A | Discharge status: Home / Self Care | Payer: Medicare Other | Source: Ambulatory Visit | Attending: Vascular Surgery | Admitting: Vascular Surgery

## 2021-08-07 ENCOUNTER — Other Ambulatory Visit: Payer: Self-pay

## 2021-08-07 DIAGNOSIS — I7103 Dissection of thoracoabdominal aorta: Secondary | ICD-10-CM | POA: Diagnosis not present

## 2021-08-07 LAB — POCT I-STAT CREATININE: Creatinine, Ser: 1 mg/dL (ref 0.61–1.24)

## 2021-08-07 MED ORDER — IOHEXOL 350 MG/ML SOLN
100.0000 mL | Freq: Once | INTRAVENOUS | Status: AC | PRN
  Administered 2021-08-07: 100 mL via INTRAVENOUS

## 2021-08-14 ENCOUNTER — Other Ambulatory Visit: Payer: Self-pay

## 2021-08-14 ENCOUNTER — Ambulatory Visit: Payer: Medicare Other | Admitting: Vascular Surgery

## 2021-08-14 ENCOUNTER — Encounter: Payer: Self-pay | Admitting: Vascular Surgery

## 2021-08-14 VITALS — BP 126/83 | HR 83 | Temp 98.0°F | Resp 18 | Ht 71.0 in | Wt 320.0 lb

## 2021-08-14 DIAGNOSIS — I7102 Dissection of abdominal aorta: Secondary | ICD-10-CM | POA: Diagnosis not present

## 2021-08-14 NOTE — Progress Notes (Signed)
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 male with hx HTN that presents for 1 year follow-up of an aortic dissection previously followed by Dr. Arbie Cookey.  Reportedly had an incidental finding of an infrarenal aortic dissection when he saw Dr. Arbie Cookey on 06/01/2019.  At the time of evaluation apparently had diverticulitis with abdominal pain and there was also an incidental finding of an infrarenal aortic dissection.  CT was done on 05/06/2019 last year with initial findings and on initial evaluation by Dr. Arbie Cookey he had no symptoms of lower extremity ischemia or other malperfusion.  This has been followed with serial CT scans.  No specific concerns today.  He is having no lower extremity claudication or other symptoms in his legs when he ambulates.  No abdominal pain.  Past Medical History:  Diagnosis Date   Dementia ([facility])    Diverticulitis    Hypercholesteremia    Hypertension    Osteoporosis    Thyroid disease     Past Surgical History:  Procedure Laterality Date   NOSE SURGERY     TRANSURETHRAL RESECTION OF PROSTATE      Family History  Problem Relation Age of Onset   Alzheimer's disease Mother    Cancer Father     SOCIAL HISTORY: Social History   Tobacco Use   Smoking status: Never   Smokeless tobacco: Never  Substance Use Topics   Alcohol use: No    Allergies  Allergen Reactions   Donepezil Diarrhea and Other (See Comments)    Abdominal pain Diarrhea Abdominal pain Diarrhea    Penicillin G Rash   Penicillins Rash    Current Outpatient Medications  Medication Sig Dispense Refill   aspirin 325 MG EC tablet Take 325 mg by mouth daily.     cholecalciferol (VITAMIN D) 1000 UNITS tablet Take 1,000 Units by mouth daily.     clorazepate (TRANXENE) 7.5 MG tablet TK 1 T PO BID     cyanocobalamin 100 MCG tablet Take 100 mcg by mouth daily.      fish oil-omega-3 fatty acids 1000 MG capsule Take 1-2 g by mouth 2 (two) times daily. Take 1 capsule in the morning and 2 capsules in the evening     levothyroxine (SYNTHROID) 125 MCG tablet      lisinopril (PRINIVIL,ZESTRIL) 10 MG tablet Take 10 mg by mouth daily.     memantine (NAMENDA) 10 MG tablet      montelukast (SINGULAIR) 10 MG tablet Take 10 mg by mouth at bedtime.     MYRBETRIQ 50 MG TB24 tablet      pantoprazole (PROTONIX) 40 MG tablet      simvastatin (ZOCOR) 40 MG tablet Take 40 mg by mouth every evening.     vitamin C (ASCORBIC ACID) 500 MG tablet Take 500 mg by mouth daily.     denosumab (PROLIA) 60 MG/ML SOSY injection Inject into the skin. (Patient not taking: No sig reported)     No current facility-administered medications for this visit.    REVIEW OF SYSTEMS:  [X]  denotes positive finding, [ ]  denotes negative finding Cardiac  Comments:  Chest pain or chest pressure:    Shortness of breath upon exertion:    Short of breath when lying flat:    Irregular heart rhythm:        Vascular    Pain in calf, thigh, or hip brought  on by ambulation:    Pain in feet at night that wakes you up from your sleep:     Blood clot in your veins:    Leg swelling:         Pulmonary    Oxygen at home:    Productive cough:     Wheezing:         Neurologic    Sudden weakness in arms or legs:     Sudden numbness in arms or legs:     Sudden onset of difficulty speaking or slurred speech:    Temporary loss of vision in one eye:     Problems with dizziness:         Gastrointestinal    Blood in stool:     Vomited blood:         Genitourinary    Burning when urinating:     Blood in urine:        Psychiatric    Major depression:         Hematologic    Bleeding problems:    Problems with blood clotting too easily:        Skin    Rashes or ulcers:        Constitutional    Fever or chills:      PHYSICAL EXAM: Vitals:   08/14/21 1239  BP: 126/83  Pulse: 83  Resp:  18  Temp: 98 F (36.7 C)  TempSrc: Temporal  SpO2: 93%  Weight: (!) 320 lb (145.2 kg)  Height: 5\' 11"  (1.803 m)    GENERAL: The patient is a well-nourished male, in no acute distress. The vital signs are documented above. CARDIAC: There is a regular rate and rhythm.  VASCULAR: Palpable femoral pulses bilaterally Palpable dorsalis pedis pulses bilaterally No lower extremity tissue loss PULMONARY: No respiratory distress ABDOMEN: Soft and non-tender.  No significant pain. MUSCULOSKELETAL: There are no major deformities or cyanosis. NEUROLOGIC: No focal weakness or paresthesias are detected.   DATA:   CTA 08/07/2021 shows stable infrarenal aortic dissection that extends into both iliac arteries.  Infrarenal aneurysm is stable at 3.1 cm.  Right common iliac artery measures 3.0 cm and stable as well.  He does have a stable ascending aortic aneurysm at 4.4 cm.  Assessment/Plan:  75 year old male presents for 1 year follow-up for a incidental dissection of the infrarenal abdominal aorta initially evaluated by Dr. 61.  Repeat CTA 08/07/2021 shows no significant interval change.  He has palpable pedal pulses and no evidence of lower extremity symptoms.  We will plan to see him again in 1 year with CTA chest abdomen pelvis for ongoing surveillance and to monitor for aneurysmal degeneration.  I also discussed the ascending aortic aneurysm measuring 4.4 cm but this has been stable on multiple prior CT scans and we can follow on future imaging.   08/09/2021, MD [facility] Office: [phone]

## 2021-09-04 DIAGNOSIS — H52222 Regular astigmatism, left eye: Secondary | ICD-10-CM | POA: Insufficient documentation

## 2021-12-30 ENCOUNTER — Emergency Department (HOSPITAL_BASED_OUTPATIENT_CLINIC_OR_DEPARTMENT_OTHER)
Admission: EM | Admit: 2021-12-30 | Discharge: 2021-12-30 | Disposition: A | Discharge status: Home / Self Care | Payer: Medicare Other | Attending: Emergency Medicine | Admitting: Emergency Medicine

## 2021-12-30 ENCOUNTER — Emergency Department (HOSPITAL_BASED_OUTPATIENT_CLINIC_OR_DEPARTMENT_OTHER): Payer: Medicare Other | Admitting: Radiology

## 2021-12-30 ENCOUNTER — Other Ambulatory Visit: Payer: Self-pay

## 2021-12-30 ENCOUNTER — Encounter (HOSPITAL_BASED_OUTPATIENT_CLINIC_OR_DEPARTMENT_OTHER): Payer: Self-pay | Admitting: *Deleted

## 2021-12-30 DIAGNOSIS — S20211A Contusion of right front wall of thorax, initial encounter: Secondary | ICD-10-CM | POA: Diagnosis not present

## 2021-12-30 DIAGNOSIS — W1830XA Fall on same level, unspecified, initial encounter: Secondary | ICD-10-CM | POA: Diagnosis not present

## 2021-12-30 DIAGNOSIS — S299XXA Unspecified injury of thorax, initial encounter: Secondary | ICD-10-CM | POA: Diagnosis present

## 2021-12-30 DIAGNOSIS — Y92410 Unspecified street and highway as the place of occurrence of the external cause: Secondary | ICD-10-CM | POA: Diagnosis not present

## 2021-12-30 DIAGNOSIS — R5383 Other fatigue: Secondary | ICD-10-CM | POA: Diagnosis not present

## 2021-12-30 DIAGNOSIS — Y9301 Activity, walking, marching and hiking: Secondary | ICD-10-CM | POA: Diagnosis not present

## 2021-12-30 DIAGNOSIS — W19XXXA Unspecified fall, initial encounter: Secondary | ICD-10-CM

## 2021-12-30 DIAGNOSIS — Z7982 Long term (current) use of aspirin: Secondary | ICD-10-CM | POA: Insufficient documentation

## 2021-12-30 DIAGNOSIS — R5381 Other malaise: Secondary | ICD-10-CM | POA: Diagnosis not present

## 2021-12-30 LAB — CBC
HCT: 44.1 % (ref 39.0–52.0)
Hemoglobin: 14.2 g/dL (ref 13.0–17.0)
MCH: 28.9 pg (ref 26.0–34.0)
MCHC: 32.2 g/dL (ref 30.0–36.0)
MCV: 89.8 fL (ref 80.0–100.0)
Platelets: 273 10*3/uL (ref 150–400)
RBC: 4.91 MIL/uL (ref 4.22–5.81)
RDW: 14.5 % (ref 11.5–15.5)
WBC: 8.1 10*3/uL (ref 4.0–10.5)
nRBC: 0 % (ref 0.0–0.2)

## 2021-12-30 LAB — BASIC METABOLIC PANEL
Anion gap: 11 (ref 5–15)
BUN: 17 mg/dL (ref 8–23)
CO2: 23 mmol/L (ref 22–32)
Calcium: 9.5 mg/dL (ref 8.9–10.3)
Chloride: 105 mmol/L (ref 98–111)
Creatinine, Ser: 1.11 mg/dL (ref 0.61–1.24)
GFR, Estimated: >60 mL/min (ref >60)
Glucose, Bld: 140 mg/dL — ABNORMAL HIGH (ref 70–99)
Potassium: 4 mmol/L (ref 3.5–5.1)
Sodium: 139 mmol/L (ref 135–145)

## 2021-12-30 LAB — TROPONIN I (HIGH SENSITIVITY)
Troponin I (High Sensitivity): 5 ng/L (ref <18)
Troponin I (High Sensitivity): 5 ng/L (ref <18)

## 2021-12-30 MED ORDER — ACETAMINOPHEN 500 MG PO TABS
1000.0000 mg | ORAL_TABLET | Freq: Once | ORAL | Status: AC
  Administered 2021-12-30: 1000 mg via ORAL
  Filled 2021-12-30: qty 2

## 2021-12-30 MED ORDER — TRAMADOL HCL 50 MG PO TABS
50.0000 mg | ORAL_TABLET | Freq: Once | ORAL | Status: AC
  Administered 2021-12-30: 50 mg via ORAL
  Filled 2021-12-30: qty 1

## 2021-12-30 MED ORDER — TRAMADOL HCL 50 MG PO TABS: ORAL_TABLET | ORAL | 0 refills | Status: DC

## 2021-12-30 NOTE — ED Triage Notes (Signed)
Pt states that he has had feeling of indigestion that goes up through his chest for several days. Has been taken tums with some relief. Lack of energy. Pt states he fell last Tuesday when he went to kick a can and he fell backwards on to the road (bruising noted to the area with clear bilateral lung sounds). C/o pain in the right lateral ribs with movement and deep breath.  ?

## 2021-12-30 NOTE — Discharge Instructions (Signed)
1.  Your chest wall pain appears due to your fall.  Take extra strength Tylenol 3 times a day.  Is taking in the morning afternoon and evening.  You may take 1-2 tramadol with the Tylenol for extra pain control.  If you tend to get constipated take a stool softener such as Colace.  Tramadol may cause constipation. ?2.  Describes some general fatigue over the past several days.  Try to rest and stay hydrated.  See your doctor for recheck in the next 2 to 3 days for recheck.  If your symptoms are worsening or new symptoms developing, return to the emergency department. ?

## 2021-12-30 NOTE — ED Provider Notes (Signed)
[facility] EMERGENCY DEPT Provider Note   CSN: [id number] Arrival date & time: 12/30/21  1849     History  Chief Complaint  Patient presents with   Steve [name] is a 76 y.o. male.  HPI Patient reports that he fell walking in the road 6 days ago.  He tried to kick a can and ended up falling backwards and landing on his right posterior chest.  He reports he was able to get back up and walk home.  He reports that the pain on the right side of his chest is actually gotten worse over the past couple of days.  He reports it is worse if he tries to bend over or moves a certain direction.  He does not feel short of breath.  In addition to this over the past few days he started to feel kind of fatigued and a little achy generally.  He has not spiked a fever.  He has not had cough.  He reports he has gotten a sensation of feeling like a warm feeling is, up through his abdomen and up his chest a couple times.  He denies he had any chest pain.  He denies there was associated nausea or vomiting.  He denies he had any urinary symptoms with pain burning urgency.  He is thought the symptom was unusual.    Home Medications Prior to Admission medications   Medication Sig Start Date End Date Taking? Authorizing Provider  traMADol (ULTRAM) 50 MG tablet 1-2 tablets every 6 hours as needed for pain 12/30/21  Yes Iana Buzan, Lebron Conners, MD  aspirin 325 MG EC tablet Take 325 mg by mouth daily.    [provider]  cholecalciferol (VITAMIN D) 1000 UNITS tablet Take 1,000 Units by mouth daily.    [provider]  clorazepate (TRANXENE) 7.5 MG tablet TK 1 T PO BID 05/11/19   [provider]  cyanocobalamin 100 MCG tablet Take 100 mcg by mouth daily.    [provider]  denosumab (PROLIA) 60 MG/ML SOSY injection Inject into the skin. Patient not taking: No sig reported    [provider]  fish oil-omega-3 fatty acids 1000 MG capsule Take 1-2 g by mouth 2  (two) times daily. Take 1 capsule in the morning and 2 capsules in the evening    [provider]  levothyroxine (SYNTHROID) 125 MCG tablet  04/21/19   [provider]  lisinopril (PRINIVIL,ZESTRIL) 10 MG tablet Take 10 mg by mouth daily.    [provider]  memantine (NAMENDA) 10 MG tablet  05/22/19   [provider]  montelukast (SINGULAIR) 10 MG tablet Take 10 mg by mouth at bedtime.    [provider]  MYRBETRIQ 50 MG TB24 tablet  03/05/19   [provider]  pantoprazole (PROTONIX) 40 MG tablet  03/22/19   [provider]  simvastatin (ZOCOR) 40 MG tablet Take 40 mg by mouth every evening.    [provider]  vitamin C (ASCORBIC ACID) 500 MG tablet Take 500 mg by mouth daily.    [provider]      Allergies    Donepezil, Penicillin g, and Penicillins    Review of Systems   Review of Systems 10 systems reviewed negative except as per HPI Physical Exam Updated Vital Signs BP (!) 112/54    Pulse 64    Temp 98.3 F (36.8 C)    Resp 18    Ht 5\' 11"  (1.803 m)  Wt 136.1 kg    SpO2 95%    BMI 41.84 kg/m  Physical Exam Constitutional:      Comments: Alert nontoxic clear mental status no respiratory distress  HENT:     Head: Normocephalic and atraumatic.     Mouth/Throat:     Pharynx: Oropharynx is clear.  Eyes:     Extraocular Movements: Extraocular movements intact.  Cardiovascular:     Rate and Rhythm: Normal rate and regular rhythm.  Pulmonary:     Effort: Pulmonary effort is normal.     Breath sounds: Normal breath sounds.     Comments: Patient has focus of reproducible chest wall pain on the right chest at about the mid axillary line and slightly anteriorly around the seventh or ninth ribs.  No crepitus. Abdominal:     General: There is no distension.     Palpations: Abdomen is soft.     Tenderness: There is no abdominal tenderness. There is no guarding.  Musculoskeletal:        General: Normal  range of motion.     Comments: No peripheral edema.  Calves are nontender.  Patient has a resolving bruise on his right knee.  No joint effusion.  Normal range of motion upper and lower extremities.  No deformities  Skin:    General: Skin is warm and dry.  Neurological:     General: No focal deficit present.     Mental Status: He is oriented to person, place, and time.     Motor: No weakness.     Coordination: Coordination normal.  Psychiatric:        Mood and Affect: Mood normal.    ED Results / Procedures / Treatments   Labs (all labs ordered are listed, but only abnormal results are displayed) Labs Reviewed  BASIC METABOLIC PANEL - Abnormal; Notable for the following components:      Result Value   Glucose, Bld 140 (*)    All other components within normal limits  CBC  TROPONIN I (HIGH SENSITIVITY)  TROPONIN I (HIGH SENSITIVITY)    EKG EKG Interpretation  Date/Time:  Sunday December 30 2021 19:11:07 EDT Ventricular Rate:  90 PR Interval:  170 QRS Duration: 98 QT Interval:  388 QTC Calculation: 474 R Axis:   45 Text Interpretation: Normal sinus rhythm Normal ECG When compared with ECG of 30-Mar-2014 18:13, Vent. rate has increased BY  32 BPM Nonspecific T wave abnormality now evident in Inferior leads T wave amplitude has decreased in Lateral leads QT has lengthened need repeat for inferior artifact Confirmed by Charlesetta Shanks [phone]) on 12/30/2021 7:19:05 PM  Radiology DG Ribs Unilateral W/Chest Right  Result Date: 12/30/2021 CLINICAL DATA:  Feeling of indigestion in the chest. Lack of energy. Fell last Tuesday. EXAM: RIGHT RIBS AND CHEST - 3+ VIEW COMPARISON:  Chest 04/09/2017 FINDINGS: Heart size and pulmonary vascularity are normal. Linear atelectasis or fibrosis suggested in the lung bases. Peribronchial thickening likely representing chronic bronchitis. No focal consolidation. No pleural effusions. No pneumothorax. Tortuous and calcified aorta. Degenerative changes in  the spine. The visualized right lower ribs appear intact. No acute displaced fractures identified. No focal bone lesion or bone destruction. Bone cortex appears intact. Soft tissues are unremarkable. IMPRESSION: 1. Linear atelectasis or fibrosis in the lung bases with chronic bronchitic changes. No focal consolidation in the lungs. 2. Negative right ribs. Electronically Signed   By: Lucienne Capers M.D.   On: 12/30/2021 20:03    Procedures Procedures  Medications Ordered in ED Medications  traMADol (ULTRAM) tablet 50 mg (50 mg Oral Given 12/30/21 2106)  acetaminophen (TYLENOL) tablet 1,000 mg (1,000 mg Oral Given 12/30/21 2106)    ED Course/ Medical Decision Making/ A&P                           Medical Decision Making Amount and/or Complexity of Data Reviewed Labs: ordered. Radiology: ordered.  Risk OTC drugs. Prescription drug management.   Patient presents with 2 complaints.  He fell 6 days ago and is having pain on the right chest wall.  He feels that the pain got worse over the past couple of days.  No shortness of breath.  No fever no productive cough.  We will proceed with chest x-ray.  Also nonspecific findings of fatigue and perception of warmth through his abdomen and chest very atypical for ACS.  However with risk factors will get troponin and EKG.  Clinically patient is well in appearance.  No distress no hypoxia no tachycardia.  Diagnostic results without acute findings.  EKG reviewed by myself with no ischemic changes.  Consistent with previous.  Chest x-ray interpretation by radiology does not show any apparent rib fractures.  No underlying acute abnormalities.  At this time with patient clinically well without vital sign instability or positive findings on EKG, chest x-ray or troponin, patient is stable for continued home management.  We reviewed the plan for pain control with Tylenol and tramadol as needed.  Patient symptoms sound more likely to be mild viral symptoms.   He has had some general malaise and a very nonspecific perception of warmth through his abdomen and chest.  We reviewed extensive return precautions and follow-up plan.          Final Clinical Impression(s) / ED Diagnoses Final diagnoses:  Fall, initial encounter  Chest wall contusion, right, initial encounter  Malaise and fatigue    Rx / DC Orders ED Discharge Orders          Ordered    traMADol (ULTRAM) 50 MG tablet        12/30/21 2344              Charlesetta Shanks, MD 12/30/21 2352

## 2022-02-07 ENCOUNTER — Encounter: Payer: Self-pay | Admitting: Physician Assistant

## 2022-02-07 ENCOUNTER — Ambulatory Visit: Payer: Self-pay

## 2022-02-07 ENCOUNTER — Ambulatory Visit (INDEPENDENT_AMBULATORY_CARE_PROVIDER_SITE_OTHER): Payer: Medicare Other | Admitting: Physician Assistant

## 2022-02-07 ENCOUNTER — Ambulatory Visit (INDEPENDENT_AMBULATORY_CARE_PROVIDER_SITE_OTHER): Payer: Medicare Other

## 2022-02-07 DIAGNOSIS — M79671 Pain in right foot: Secondary | ICD-10-CM

## 2022-02-07 DIAGNOSIS — M2141 Flat foot [pes planus] (acquired), right foot: Secondary | ICD-10-CM

## 2022-02-07 DIAGNOSIS — M25561 Pain in right knee: Secondary | ICD-10-CM

## 2022-02-07 DIAGNOSIS — M25562 Pain in left knee: Secondary | ICD-10-CM

## 2022-02-07 MED ORDER — METHYLPREDNISOLONE ACETATE 40 MG/ML IJ SUSP
40.0000 mg | INTRAMUSCULAR | Status: AC | PRN
  Administered 2022-02-07: 40 mg via INTRA_ARTICULAR

## 2022-02-07 MED ORDER — LIDOCAINE HCL 1 % IJ SOLN
3.0000 mL | INTRAMUSCULAR | Status: AC | PRN
  Administered 2022-02-07: 3 mL

## 2022-02-07 NOTE — Progress Notes (Signed)
? ?Office Visit Note ?  ?Patient: Steve [name]           ?Date of Birth: 27-Aug-1946           ?MRN: [id number] ?Visit Date: 02/07/2022 ?             ?Requested by: Creola Corn, MD ?[address] ?[place],  [place] [address] ?PCP: Creola Corn, MD ? ? ?Assessment & Plan: ?Visit Diagnoses:  ?1. Acute pain of right knee   ?2. Pain of right heel   ? ? ?Plan: Discussed proper shoe wear.  Assistant getting arch supports for his feet please be medial arch support no changes shoes out daily.  We will send him to physical therapy for bilateral posterior tibial tendon insufficiency they will work on strengthening home exercises include modalities.  We will also have them work on quad strengthening both knees at that time.  Having follow-up with [place] in 4 to 6 weeks see how he is doing overall.  Questions were encouraged and answered at length today. ? ?Follow-Up Instructions: Return in about 4 weeks (around 03/07/2022).  ? ?Orders:  ?Orders Placed This Encounter  ?Procedures  ? Large Joint Inj  ? XR Knee 1-2 Views Right  ? XR Os Calcis Right  ? ?No orders of the defined types were placed in this encounter. ? ? ? ? Procedures: ?Large Joint Inj: R knee on 02/07/2022 5:44 PM ?Indications: pain ?Details: 22 G 1.5 in needle, anterolateral approach ? ?Arthrogram: No ? ?Medications: 3 mL lidocaine 1 %; 40 mg methylPREDNISolone acetate 40 MG/ML ?Outcome: tolerated well, no immediate complications ?Procedure, treatment alternatives, risks and benefits explained, specific risks discussed. Consent was given by the patient. Immediately prior to procedure a time out was called to verify the correct patient, procedure, equipment, support staff and site/side marked as required. Patient was prepped and draped in the usual sterile fashion.  ? ? ? ? ?Clinical Data: ?No additional findings. ? ? ?Subjective: ?Chief Complaint  ?Patient presents with  ? Right Knee - Pain  ? Right Heel - Pain  ? ? ?HPI ?Steve [name] comes in today with 2 complaints right  knee pain and right heel pain.  He states he fell and tripped on his knee 3 to 4 weeks ago.  He does not have constant pain in the knee but does feel like it gives way.  He has tried ibuprofen Tylenol and ice.  He feels some he has had no swelling in the knee.  He is also having right heel pain and feels like the heel gives way on him.  He denies any pain for step in the morning.  He has had no injury to the right heel. ? ?Review of Systems ?See HPI otherwise negative ? ?Objective: ?Vital Signs: There were no vitals taken for this visit. ? ?Physical Exam ?General: Well-developed well-nourished male in no acute distress ?Psych: Alert and oriented x3 ? ?Ortho Exam ?Bilateral knees good range of motion of both knees.  He has tenderness along medial lateral joint line of both knees.  No abnormal warmth erythema or effusion of either knee.  No instability valgus varus stressing of either knee. ?Bilateral foot: Pes planovalgus bilaterally.  Unable to perform heel raise bilaterally.  No weakness with inversion eversion of either foot against resistance.  Nontender over the peroneal tendons posterior tibial tendons bilaterally.  Nontender over the Achilles bilaterally.  Full dorsiflexion plantarflexion bilateral ankles.  No rashes skin lesions ulcerations or impending ulcers of either foot.  Sensation grossly intact bilateral feet light touch.  Dorsal pedal pulses are 2+ bilaterally equal symmetric. ?Specialty Comments:  ?No specialty comments available. ? ?Imaging: ?XR Knee 1-2 Views Right ? ?Result Date: 02/07/2022 ?Right knee: No acute fracture. Moderate medial compartmental narrowing. Mild to moderate patella femoral changes. Knee well located.  ? ?XR Os Calcis Right ? ?Result Date: 02/07/2022 ?Right os Calcis: No acute fracture. No bony abnormalities.   ? ? ?PMFS History: ?Patient Active Problem List  ? Diagnosis Date Noted  ? Aortic dissection (HCC) 08/15/2020  ? ?Past Medical History:  ?Diagnosis Date  ? Dementia (HCC)    ? Diverticulitis   ? Hypercholesteremia   ? Hypertension   ? Osteoporosis   ? Thyroid disease   ?  ?Family History  ?Problem Relation Age of Onset  ? Alzheimer's disease Mother   ? Cancer Father   ?  ?Past Surgical History:  ?Procedure Laterality Date  ? NOSE SURGERY    ? TRANSURETHRAL RESECTION OF PROSTATE    ? ?Social History  ? ?Occupational History  ? Not on file  ?Tobacco Use  ? Smoking status: Never  ? Smokeless tobacco: Never  ?Vaping Use  ? Vaping Use: Never used  ?Substance and Sexual Activity  ? Alcohol use: No  ? Drug use: No  ? Sexual activity: Not on file  ? ? ? ? ? ? ?

## 2022-02-08 NOTE — Addendum Note (Signed)
Addended by: Barbette Or on: 02/08/2022 09:13 AM ? ? Modules accepted: Orders ? ?

## 2022-03-05 ENCOUNTER — Ambulatory Visit (HOSPITAL_BASED_OUTPATIENT_CLINIC_OR_DEPARTMENT_OTHER): Payer: Medicare Other | Admitting: Physical Therapy

## 2022-03-05 NOTE — Therapy (Incomplete)
?OUTPATIENT PHYSICAL THERAPY LOWER EXTREMITY EVALUATION ? ? ?Patient Name: Steve [name] ?MRN: [id number] ?DOB:Oct 13, 1946, 76 y.o., male ?Today's Date: 03/05/2022 ? ? ? ?Past Medical History:  ?Diagnosis Date  ? Dementia (HCC)   ? Diverticulitis   ? Hypercholesteremia   ? Hypertension   ? Osteoporosis   ? Thyroid disease   ? ?Past Surgical History:  ?Procedure Laterality Date  ? NOSE SURGERY    ? TRANSURETHRAL RESECTION OF PROSTATE    ? ?Patient Active Problem List  ? Diagnosis Date Noted  ? Aortic dissection (HCC) 08/15/2020  ? ? ?PCP: *** ? ?REFERRING PROVIDER: *** ? ?REFERRING DIAG: *** ? ?THERAPY DIAG:  ?No diagnosis found. ? ?ONSET DATE: *** ? ?SUBJECTIVE:  ? ?SUBJECTIVE STATEMENT: ?Pt had an injection to R knee on 02/07/2022 ? ?PERTINENT HISTORY: ?*** ? ?PAIN:  ?Are you having pain? {OPRCPAIN:27236} ? ?PRECAUTIONS: {Therapy precautions:24002} ? ?WEIGHT BEARING RESTRICTIONS {Yes ***/No:24003} ? ?FALLS:  ?Has patient fallen in last 6 months? {fallsyesno:27318} ? ?LIVING ENVIRONMENT: ?Lives with: {OPRC lives with:25569::"lives with their family"} ?Lives in: {Lives in:25570} ?Stairs: {opstairs:27293} ?Has following equipment at home: {Assistive devices:23999} ? ?OCCUPATION: *** ? ?PLOF: {PLOF:24004} ? ?PATIENT GOALS *** ? ? ?OBJECTIVE:  ? ?DIAGNOSTIC FINDINGS: *** ? ?PATIENT SURVEYS:  ?{rehab surveys:24030} ? ?COGNITION: ? Overall cognitive status: {cognition:24006}   ?  ?SENSATION: ?{sensation:27233} ? ?MUSCLE LENGTH: ?Hamstrings: Right *** deg; Left *** deg ?Thomas test: Right *** deg; Left *** deg ? ?POSTURE:  ?*** ? ?PALPATION: ?*** ? ?LE ROM: ? ?{AROM/PROM:27142} ROM Right ?03/05/2022 Left ?03/05/2022  ?Hip flexion    ?Hip extension    ?Hip abduction    ?Hip adduction    ?Hip internal rotation    ?Hip external rotation    ?Knee flexion    ?Knee extension    ?Ankle dorsiflexion    ?Ankle plantarflexion    ?Ankle inversion    ?Ankle eversion    ? (Blank rows = not tested) ? ?LE MMT: ? ?MMT Right ?03/05/2022  Left ?03/05/2022  ?Hip flexion    ?Hip extension    ?Hip abduction    ?Hip adduction    ?Hip internal rotation    ?Hip external rotation    ?Knee flexion    ?Knee extension    ?Ankle dorsiflexion    ?Ankle plantarflexion    ?Ankle inversion    ?Ankle eversion    ? (Blank rows = not tested) ? ?LOWER EXTREMITY SPECIAL TESTS:  ?{LEspecialtests:26242} ? ?FUNCTIONAL TESTS:  ?{Functional tests:24029} ? ?GAIT: ?Distance walked: *** ?Assistive device utilized: {Assistive devices:23999} ?Level of assistance: {Levels of assistance:24026} ?Comments: *** ? ? ? ?TODAY'S TREATMENT: ?*** ? ? ?PATIENT EDUCATION:  ?Education details: *** ?Person educated: {Person educated:25204} ?Education method: {Education Method:25205} ?Education comprehension: {Education Comprehension:25206} ? ? ?HOME EXERCISE PROGRAM: ?*** ? ?ASSESSMENT: ? ?CLINICAL IMPRESSION: ?Patient is a *** y.o. *** who was seen today for physical therapy evaluation and treatment for ***.  ? ? ?OBJECTIVE IMPAIRMENTS {opptimpairments:25111}.  ? ?ACTIVITY LIMITATIONS {activity limitations:25113}.  ? ?PERSONAL FACTORS {Personal factors:25162} are also affecting patient's functional outcome.  ? ? ?REHAB POTENTIAL: {rehabpotential:25112} ? ?CLINICAL DECISION MAKING: {clinical decision making:25114} ? ?EVALUATION COMPLEXITY: {Evaluation complexity:25115} ? ? ?GOALS: ?Goals reviewed with patient? {yes/no:20286} ? ?SHORT TERM GOALS: Target date: {follow up:25551}  (Remove Blue Hyperlink) ? ?*** ?Baseline: ?Goal status: {GOALSTATUS:25110} ? ?2.  *** ?Baseline:  ?Goal status: {GOALSTATUS:25110} ? ?3.  *** ?Baseline:  ?Goal status: {GOALSTATUS:25110} ? ?4.  *** ?Baseline:  ?Goal status: {GOALSTATUS:25110} ? ?5.  *** ?Baseline:  ?Goal  status: {GOALSTATUS:25110} ? ?6.  *** ?Baseline:  ?Goal status: {GOALSTATUS:25110} ? ?LONG TERM GOALS: Target date: {follow up:25551}  (Remove Blue Hyperlink) ? ?*** ?Baseline:  ?Goal status: {GOALSTATUS:25110} ? ?2.  *** ?Baseline:  ?Goal status:  {GOALSTATUS:25110} ? ?3.  *** ?Baseline:  ?Goal status: {GOALSTATUS:25110} ? ?4.  *** ?Baseline:  ?Goal status: {GOALSTATUS:25110} ? ?5.  *** ?Baseline:  ?Goal status: {GOALSTATUS:25110} ? ?6.  *** ?Baseline:  ?Goal status: {GOALSTATUS:25110} ? ? ?PLAN: ?PT FREQUENCY: {rehab frequency:25116} ? ?PT DURATION: {rehab duration:25117} ? ?PLANNED INTERVENTIONS: {rehab planned interventions:25118::"Therapeutic exercises","Therapeutic activity","Neuromuscular re-education","Balance training","Gait training","Patient/Family education","Joint mobilization"} ? ?PLAN FOR NEXT SESSION: *** ? ? ?Aaron Edelman, PT ?03/05/2022, 7:21 AM ? ?

## 2022-03-07 ENCOUNTER — Ambulatory Visit: Payer: Medicare Other | Admitting: Physician Assistant

## 2022-03-13 ENCOUNTER — Ambulatory Visit: Payer: Medicare Other | Admitting: Physician Assistant

## 2022-03-26 ENCOUNTER — Other Ambulatory Visit (HOSPITAL_COMMUNITY): Payer: Self-pay | Admitting: *Deleted

## 2022-03-27 ENCOUNTER — Ambulatory Visit (HOSPITAL_COMMUNITY)
Admission: RE | Admit: 2022-03-27 | Discharge: 2022-03-27 | Disposition: A | Discharge status: Home / Self Care | Payer: Medicare Other | Source: Ambulatory Visit | Attending: Internal Medicine | Admitting: Internal Medicine

## 2022-03-27 DIAGNOSIS — M25561 Pain in right knee: Secondary | ICD-10-CM | POA: Insufficient documentation

## 2022-03-27 MED ORDER — ZOLEDRONIC ACID 5 MG/100ML IV SOLN: 5.0000 mg | Freq: Once | INTRAVENOUS | Status: AC

## 2022-03-27 MED ORDER — ZOLEDRONIC ACID 5 MG/100ML IV SOLN
INTRAVENOUS | Status: AC
  Administered 2022-03-27: 5 mg via INTRAVENOUS
  Filled 2022-03-27: qty 100

## 2022-03-29 ENCOUNTER — Ambulatory Visit (HOSPITAL_BASED_OUTPATIENT_CLINIC_OR_DEPARTMENT_OTHER): Payer: Medicare Other | Attending: Physician Assistant | Admitting: Physical Therapy

## 2022-03-29 ENCOUNTER — Encounter (HOSPITAL_BASED_OUTPATIENT_CLINIC_OR_DEPARTMENT_OTHER): Payer: Self-pay | Admitting: Physical Therapy

## 2022-03-29 DIAGNOSIS — M25571 Pain in right ankle and joints of right foot: Secondary | ICD-10-CM | POA: Insufficient documentation

## 2022-03-29 DIAGNOSIS — M25561 Pain in right knee: Secondary | ICD-10-CM | POA: Diagnosis not present

## 2022-03-29 DIAGNOSIS — R262 Difficulty in walking, not elsewhere classified: Secondary | ICD-10-CM | POA: Diagnosis present

## 2022-03-29 NOTE — Therapy (Incomplete)
OUTPATIENT PHYSICAL THERAPY LOWER EXTREMITY EVALUATION   Patient Name: Steve [name] MRN: [id number] DOB:11/05/1945, 76 y.o., male Today's Date: 03/29/2022    Past Medical History:  Diagnosis Date   Dementia (HCC)    Diverticulitis    Hypercholesteremia    Hypertension    Osteoporosis    Thyroid disease    Past Surgical History:  Procedure Laterality Date   NOSE SURGERY     TRANSURETHRAL RESECTION OF PROSTATE     Patient Active Problem List   Diagnosis Date Noted   Aortic dissection (HCC) 08/15/2020    PCP: Creola Corn, MD   REFERRING PROVIDER: Kirtland Bouchard, PA-C   REFERRING DIAG:  [phone] (ICD-10-CM) - Acute pain of right knee For bilateral posterior tibial tendon insufficiency   THERAPY DIAG:  No diagnosis found.  Rationale for Evaluation and Treatment Rehabilitation  ONSET DATE: 4 months ago  SUBJECTIVE:   SUBJECTIVE STATEMENT: Pt presents with c/c of R ankle pain of sub-acute onset. Pt reports a fall while walking in March 2023 and had R ankle and bil knee pain following. Pt reports bil knee injections 3 weeks ago and resolution of knee pain following but R ankle pain is persistent. Pain is limiting pt walking/exercise, ascending/descending stairs, and says it occasionally feels unstable.   PERTINENT HISTORY: -Pt reports a torn/sprained ligament of medial R ankle 3 years prior -Osteoporosis, dementia -Prior PT in 2019 for L posterior tib pain  PAIN:  Are you having pain? Yes: NPRS scale: 5/10 Pain location: Posterior/medial ankle and heel Pain description: Feeling like its going to give way, sharp Aggravating factors: Walking,  Relieving factors: Ice, NSAIDS  PRECAUTIONS: None  WEIGHT BEARING RESTRICTIONS No  FALLS:  Has patient fallen in last 6 months? Yes. Number of falls 1 - walking  LIVING ENVIRONMENT: Lives with: lives with their spouse Lives in: House/apartment Stairs: No Has following equipment at home: None  OCCUPATION:  Retired  PLOF: Independent  PATIENT GOALS  Pt wants to be able to walk for exercise without pain.   OBJECTIVE:   DIAGNOSTIC FINDINGS:  R calcaneus - No acute fracture, no bony abnormalitites R knee - moderate medial compartmental narrowing, mild to moderate patella femoral changes  PATIENT SURVEYS:  FOTO 83  initial (risk-adjusted 48) 80 expected   COGNITION:  Overall cognitive status: Within functional limits for tasks assessed     SENSATION: WFL  EDEMA:  None present.  POSTURE:  Bil decreased medial arch height  PALPATION: Tender to palpation of post tib tendon, posterior to medial malleolus  LOWER EXTREMITY ROM:  Active ROM Right eval Left eval  Hip flexion    Hip extension    Hip abduction    Hip adduction    Hip internal rotation    Hip external rotation    Knee flexion    Knee extension    Ankle dorsiflexion 11 13  Ankle plantarflexion [facility] WFL  Ankle inversion 35 35  Ankle eversion 25 25   (Blank rows = not tested)  LOWER EXTREMITY MMT:  MMT Right eval Left eval  Hip flexion 67.4 71.5  Hip extension    Hip abduction    Hip adduction    Hip internal rotation    Hip external rotation    Knee flexion    Knee extension 60.1 55.8  Ankle dorsiflexion 4+ 4+  Ankle plantarflexion 2+ 2+  Ankle inversion 4+ 5  Ankle eversion 4, painful 4   (Blank rows = not tested)   Plantarflexion - pt  unable to perform single leg heel raise in testing position.  FUNCTIONAL TESTS:  Single leg heel raise - unable to perform   TODAY'S TREATMENT: Eval Heel/toe raises w/counter support Dorsiflexion w/red band Inversion w/red band   PATIENT EDUCATION:  Education details: HEP and exercise, musculature and arches of foot, foot orthotic inserts/proper shoewear Person educated: Patient Education method: Explanation Education comprehension: verbalized understanding   HOME EXERCISE PROGRAM: Heel/toe raises w/counter support Dorsiflexion w/red  band Inversion w/red band  ASSESSMENT:  CLINICAL IMPRESSION: Patient is a 76 y.o. male who was seen today for physical therapy evaluation and treatment for R knee and ankle pain. Knee pain has resolved following injections. Patient presents with symptoms consistent with posterior tibialis dysfunction - decreased arch height, weakness in dorsiflexion and inversion, and tenderness to palpation over tendon and insertion. Pt also has limitations in single leg stability, balance, and slight dorsiflexion ROM. Pt will benefit from skilled physical therapy to improve these limitations and return patient to full activity.   OBJECTIVE IMPAIRMENTS decreased balance, decreased coordination, difficulty walking, decreased ROM, and decreased strength.   ACTIVITY LIMITATIONS standing and walking  PARTICIPATION LIMITATIONS: community activity and yard work  PERSONAL FACTORS Age, Past/current experiences, and Time since onset of injury/illness/exacerbation are also affecting patient's functional outcome.   REHAB POTENTIAL: Good  CLINICAL DECISION MAKING: Stable/uncomplicated  EVALUATION COMPLEXITY: Low   GOALS: Goals reviewed with patient? Yes  SHORT TERM GOALS: Target date: {:PHR,WPLUS3}  Patient will improve ankle inversion strength to 5/5 to improve stability and arch support. Baseline: Goal status: INITIAL  2.  Patient will be able to perform a single leg heel raise with UE support to demonstrate improved plantarflexion strength and stability. Baseline:  Goal status: INITIAL  3.  Patient will improve dorsiflexion ROM to 15deg bilaterally to improve gait mechanics. Baseline:  Goal status: INITIAL   LONG TERM GOALS: Target date: {:PHR,WPLUS6}   Patient will be able to walk community distances without pain. Baseline:  Goal status: INITIAL  2.  Patient will be able to ascend and descend a flight of stairs with no pain. Baseline:  Goal status: INITIAL  3.  Patient will be fully  independent with exercise program to maintain current level of health and fitness. Baseline:  Goal status: INITIAL    PLAN: PT FREQUENCY: 1x/week  PT DURATION: 6 weeks  PLANNED INTERVENTIONS: Therapeutic exercises, Therapeutic activity, Neuromuscular re-education, Balance training, Gait training, Patient/Family education, Joint mobilization, Dry Needling, Cryotherapy, and Moist heat  PLAN FOR NEXT SESSION: Single leg stability and balance, foot intrinsic and ankle strengthening, hip/knee strength, arch support training   Loura Halt, Student-PT 03/29/2022, 1:07 PM   During this treatment session, the therapist was present, participating in and directing the treatment.

## 2022-04-01 ENCOUNTER — Encounter (HOSPITAL_BASED_OUTPATIENT_CLINIC_OR_DEPARTMENT_OTHER): Payer: Self-pay | Admitting: Physical Therapy

## 2022-04-01 NOTE — Therapy (Unsigned)
OUTPATIENT PHYSICAL THERAPY LOWER EXTREMITY EVALUATION   Patient Name: Steve [name] MRN: [id number] DOB:August 27, 1946, 76 y.o., male Today's Date: 04/01/2022    Past Medical History:  Diagnosis Date   Dementia (HCC)    Diverticulitis    Hypercholesteremia    Hypertension    Osteoporosis    Thyroid disease    Past Surgical History:  Procedure Laterality Date   NOSE SURGERY     TRANSURETHRAL RESECTION OF PROSTATE     Patient Active Problem List   Diagnosis Date Noted   Aortic dissection (HCC) 08/15/2020  PCP: Creola Corn, MD    REFERRING PROVIDER: Kirtland Bouchard, PA-C    REFERRING DIAG:  [phone] (ICD-10-CM) - Acute pain of right knee For bilateral posterior tibial tendon insufficiency    THERAPY DIAG:  No diagnosis found.   Rationale for Evaluation and Treatment Rehabilitation   ONSET DATE: 4 months ago   SUBJECTIVE:    SUBJECTIVE STATEMENT: Pt presents with c/c of R ankle pain of sub-acute onset. Pt reports a fall while walking in March 2023 and had R ankle and bil knee pain following. Pt reports bil knee injections 3 weeks ago and resolution of knee pain following but R ankle pain is persistent. Pain is limiting pt walking/exercise, ascending/descending stairs, and says it occasionally feels unstable.    PERTINENT HISTORY: -Pt reports a torn/sprained ligament of medial R ankle 3 years prior -Osteoporosis, dementia -Prior PT in 2019 for L posterior tib pain   PAIN:  Are you having pain? Yes: NPRS scale: 5/10 Pain location: Posterior/medial ankle and heel Pain description: Feeling like its going to give way, sharp Aggravating factors: Walking,  Relieving factors: Ice, NSAIDS   PRECAUTIONS: None   WEIGHT BEARING RESTRICTIONS No   FALLS:  Has patient fallen in last 6 months? Yes. Number of falls 1 - walking   LIVING ENVIRONMENT: Lives with: lives with their spouse Lives in: House/apartment Stairs: No Has following equipment at home: None    OCCUPATION: Retired   PLOF: Independent   PATIENT GOALS  Pt wants to be able to walk for exercise without pain.     OBJECTIVE:    DIAGNOSTIC FINDINGS:  R calcaneus - No acute fracture, no bony abnormalitites R knee - moderate medial compartmental narrowing, mild to moderate patella femoral changes   PATIENT SURVEYS:  FOTO 83  initial (risk-adjusted 48) 80 expected    COGNITION:           Overall cognitive status: Within functional limits for tasks assessed                          SENSATION: WFL   EDEMA:  None present.   POSTURE:  Bil decreased medial arch height,  standing>seated   PALPATION: Tender to palpation of post tib tendon, posterior to medial malleolus   LOWER EXTREMITY ROM:   Active ROM Right eval Left eval  Hip flexion      Hip extension      Hip abduction      Hip adduction      Hip internal rotation      Hip external rotation      Knee flexion      Knee extension      Ankle dorsiflexion 11 13  Ankle plantarflexion [facility] WFL  Ankle inversion 35 35  Ankle eversion 25 25   (Blank rows = not tested)   LOWER EXTREMITY MMT:   MMT Right eval Left eval  Hip flexion 67.4 71.5  Hip extension      Hip abduction      Hip adduction      Hip internal rotation      Hip external rotation      Knee flexion      Knee extension 60.1 55.8  Ankle dorsiflexion 4+ 4+  Ankle plantarflexion 2+ 2+  Ankle inversion 4+ 5  Ankle eversion 4, painful 4   (Blank rows = not tested)       FUNCTIONAL TESTS:  Single leg heel raise - unable to perform     TODAY'S TREATMENT: Eval Heel/toe raises w/counter support Dorsiflexion w/red band Inversion w/red band     PATIENT EDUCATION:  Education details: HEP and exercise, musculature and arches of foot, foot orthotic inserts/proper shoewear Person educated: Patient Education method: Explanation Education comprehension: verbalized understanding     HOME EXERCISE PROGRAM: Heel/toe raises w/counter  support Dorsiflexion w/red band Inversion w/red band   ASSESSMENT:   CLINICAL IMPRESSION: Patient is a 76 y.o. male who was seen today for physical therapy evaluation and treatment for R knee and ankle pain. Knee pain has resolved following injections. Patient presents with symptoms consistent with posterior tibialis dysfunction - decreased arch height, weakness in dorsiflexion and inversion, and tenderness to palpation over tendon and insertion. Pt also has limitations in single leg stability, balance, and slight dorsiflexion ROM. Pt will benefit from skilled physical therapy to improve these limitations and return patient to full activity.     OBJECTIVE IMPAIRMENTS decreased balance, decreased coordination, difficulty walking, decreased ROM, and decreased strength.    ACTIVITY LIMITATIONS standing and walking   PARTICIPATION LIMITATIONS: community activity and yard work   PERSONAL FACTORS Age, Past/current experiences, and Time since onset of injury/illness/exacerbation are also affecting patient's functional outcome.    REHAB POTENTIAL: Good   CLINICAL DECISION MAKING: Stable/uncomplicated   EVALUATION COMPLEXITY: Low     GOALS: Goals reviewed with patient? Yes   SHORT TERM GOALS: Target date: 6/30 Patient will improve ankle inversion strength to 5/5 to improve stability and arch support. Baseline: Goal status: INITIAL   2.  Patient will be able to perform a single leg heel raise with UE support to demonstrate improved plantarflexion strength and stability. Baseline:  Goal status: INITIAL   3.  Patient will improve dorsiflexion ROM to 15deg bilaterally to improve gait mechanics. Baseline:  Goal status: INITIAL     LONG TERM GOALS: Target date: 7/21   Patient will be able to walk community distances without pain. Baseline:  Goal status: INITIAL   2.  Patient will be able to ascend and descend a flight of stairs with no pain. Baseline:  Goal status: INITIAL   3.   Patient will be fully independent with exercise program to maintain current level of health and fitness. Baseline:  Goal status: INITIAL       PLAN: PT FREQUENCY: 1x/week   PT DURATION: 6 weeks   PLANNED INTERVENTIONS: Therapeutic exercises, Therapeutic activity, Neuromuscular re-education, Balance training, Gait training, Patient/Family education, Joint mobilization, Dry Needling, Cryotherapy, and Moist heat   PLAN FOR NEXT SESSION: Single leg stability and balance, foot intrinsic and ankle strengthening, hip/knee strength, arch support training     Loura Halt, Student-PT 03/29/2022, 1:07 PM    During this treatment session, the therapist was present, participating in and directing the treatment.    Dessie Coma, PT 04/01/2022, 12:20 PM

## 2022-04-03 ENCOUNTER — Encounter (HOSPITAL_BASED_OUTPATIENT_CLINIC_OR_DEPARTMENT_OTHER): Payer: Self-pay | Admitting: Physical Therapy

## 2022-04-18 ENCOUNTER — Encounter (HOSPITAL_BASED_OUTPATIENT_CLINIC_OR_DEPARTMENT_OTHER): Payer: Medicare Other | Admitting: Physical Therapy

## 2022-04-26 ENCOUNTER — Encounter (HOSPITAL_BASED_OUTPATIENT_CLINIC_OR_DEPARTMENT_OTHER): Payer: Medicare Other | Admitting: Physical Therapy

## 2022-04-30 ENCOUNTER — Ambulatory Visit
Admission: RE | Admit: 2022-04-30 | Discharge: 2022-04-30 | Disposition: A | Discharge status: Home / Self Care | Payer: Medicare Other | Source: Ambulatory Visit | Attending: Emergency Medicine | Admitting: Emergency Medicine

## 2022-04-30 VITALS — BP 123/80 | HR 80 | Temp 97.8°F | Resp 18

## 2022-04-30 DIAGNOSIS — H109 Unspecified conjunctivitis: Secondary | ICD-10-CM

## 2022-04-30 MED ORDER — TOBRAMYCIN-DEXAMETHASONE 0.3-0.1 % OP SUSP: 1.0000 [drp] | OPHTHALMIC | 0 refills | Status: AC

## 2022-04-30 NOTE — ED Triage Notes (Signed)
Pt states since last Thursday he has been having drainage, itching and irritation to his left eye.   Home interventions: neomycin- polyb eye ointment

## 2022-04-30 NOTE — ED Provider Notes (Signed)
[facility]    CSN: [id number] Arrival date & time: 04/30/22  1723    HISTORY   Chief Complaint  Patient presents with   Eye Problem    Feel like eye infected.  Drainage, yellow discharge,  itching,  irritation - Entered by patient   HPI Steve [name] is a pleasant, 76 y.o. male who presents to urgent care today complaining of irritation in his left eye for the past 5 nights which includes drainage, which is worse in the morning, itchiness and irritation, more so at the medial aspect of his left eye.  Patient denies swelling of upper or lower eyelid, vision changes, eye pain, discharge from the eye throughout the day.  Patient denies known trauma to his left eye.  Patient states he had some old neomycin polymyxin eye ointment that was provided to him about a year ago but does not not believe that this is helping.  The history is provided by the patient.   Past Medical History:  Diagnosis Date   Dementia (HCC)    Diverticulitis    Hypercholesteremia    Hypertension    Osteoporosis    Thyroid disease    Patient Active Problem List   Diagnosis Date Noted   Aortic dissection (HCC) 08/15/2020   Past Surgical History:  Procedure Laterality Date   NOSE SURGERY     TRANSURETHRAL RESECTION OF PROSTATE      Home Medications    Prior to Admission medications   Medication Sig Start Date End Date Taking? Authorizing Provider  aspirin 325 MG EC tablet Take 325 mg by mouth daily.    [provider]  cholecalciferol (VITAMIN D) 1000 UNITS tablet Take 1,000 Units by mouth daily.    [provider]  clorazepate (TRANXENE) 7.5 MG tablet TK 1 T PO BID 05/11/19   [provider]  cyanocobalamin 100 MCG tablet Take 100 mcg by mouth daily.    [provider]  fish oil-omega-3 fatty acids 1000 MG capsule Take 1-2 g by mouth 2 (two) times daily. Take 1 capsule in the morning and 2 capsules in the evening    [provider]  levothyroxine  (SYNTHROID) 125 MCG tablet  04/21/19   [provider]  lisinopril (PRINIVIL,ZESTRIL) 10 MG tablet Take 10 mg by mouth daily.    [provider]  memantine (NAMENDA) 10 MG tablet  05/22/19   [provider]  montelukast (SINGULAIR) 10 MG tablet Take 10 mg by mouth at bedtime.    [provider]  MYRBETRIQ 50 MG TB24 tablet  03/05/19   [provider]  pantoprazole (PROTONIX) 40 MG tablet  03/22/19   [provider]  simvastatin (ZOCOR) 40 MG tablet Take 40 mg by mouth every evening.    [provider]  traMADol Janean Sark) 50 MG tablet 1-2 tablets every 6 hours as needed for pain 12/30/21   Arby Barrette, MD  vitamin C (ASCORBIC ACID) 500 MG tablet Take 500 mg by mouth daily.    [provider]    Family History Family History  Problem Relation Age of Onset   Alzheimer's disease Mother    Cancer Father    Social History Social History   Tobacco Use   Smoking status: Never   Smokeless tobacco: Never  Vaping Use   Vaping Use: Never used  Substance Use Topics   Alcohol use: No   Drug use: No   Allergies   Donepezil, Penicillin g, and Penicillins  Review of  Systems Review of Systems Pertinent findings revealed after performing a 14 point review of systems has been noted in the history of present illness.  Physical Exam Triage Vital Signs ED Triage Vitals  Enc Vitals Group     BP 08/17/21 0827 (!) 147/82     Pulse Rate 08/17/21 0827 72     Resp 08/17/21 0827 18     Temp 08/17/21 0827 98.3 F (36.8 C)     Temp Source 08/17/21 0827 Oral     SpO2 08/17/21 0827 98 %     Weight --      Height --      Head Circumference --      Peak Flow --      Pain Score 08/17/21 0826 5     Pain Loc --      Pain Edu? --      Excl. in GC? --   No data found.  Updated Vital Signs BP 123/80 (BP Location: Right Arm)   Pulse 80   Temp 97.8 F (36.6 C) (Oral)   Resp 18   SpO2 93%   Physical Exam Vitals and nursing note  reviewed.  Constitutional:      General: He is not in acute distress.    Appearance: Normal appearance. He is normal weight. He is not ill-appearing.  HENT:     Head: Normocephalic and atraumatic.     Right Ear: Tympanic membrane, ear canal and external ear normal.     Left Ear: Tympanic membrane, ear canal and external ear normal.     Nose: Nose normal.     Mouth/Throat:     Mouth: Mucous membranes are moist.     Pharynx: Oropharynx is clear.  Eyes:     General: Lids are normal. Lids are everted, no foreign bodies appreciated. Vision grossly intact. Gaze aligned appropriately. No allergic shiner, visual field deficit or scleral icterus.       Right eye: No foreign body, discharge or hordeolum.        Left eye: Discharge (At medial canthus) present.No foreign body or hordeolum.     Extraocular Movements: Extraocular movements intact.     Right eye: Normal extraocular motion and no nystagmus.     Left eye: Normal extraocular motion and no nystagmus.     Conjunctiva/sclera: Conjunctivae normal.     Right eye: Right conjunctiva is not injected. No chemosis, exudate or hemorrhage.    Left eye: Left conjunctiva is not injected. No chemosis, exudate or hemorrhage.    Pupils: Pupils are equal, round, and reactive to light.   Cardiovascular:     Rate and Rhythm: Normal rate and regular rhythm.  Pulmonary:     Effort: Pulmonary effort is normal.     Breath sounds: Normal breath sounds.  Musculoskeletal:        General: Normal range of motion.     Cervical back: Normal range of motion and neck supple.  Skin:    General: Skin is warm and dry.  Neurological:     General: No focal deficit present.     Mental Status: He is alert and oriented to person, place, and time. Mental status is at baseline.  Psychiatric:        Mood and Affect: Mood normal.        Behavior: Behavior normal.        Thought Content: Thought content normal.        Judgment: Judgment normal.     Visual  Acuity  Right Eye Distance:   Left Eye Distance:   Bilateral Distance:    Right Eye Near:   Left Eye Near:    Bilateral Near:     UC Couse / Diagnostics / Procedures:     Radiology No results found.  Procedures Procedures (including critical care time) EKG  Pending results:  Labs Reviewed - No data to display  Medications Ordered in UC: Medications - No data to display  UC Diagnoses / Final Clinical Impressions(s)   I have reviewed the triage vital signs and the nursing notes.  Pertinent labs & imaging results that were available during my care of the patient were reviewed by me and considered in my medical decision making (see chart for details).    Final diagnoses:  Conjunctivitis of left eye, unspecified conjunctivitis type   Physical exam for the most part is fairly unremarkable for acute bacterial conjunctivitis but he does appear to have some mild irritation in the medial aspect of his left eyelid.  Will provide patient with a prescription for TobraDex eyedrops for 5 days, patient advised to follow-up with ophthalmologist if no improvement.  ED Prescriptions     Medication Sig Dispense Auth. Provider   tobramycin-dexamethasone [facility]) ophthalmic solution Place 1 drop into both eyes every 4 (four) hours while awake. For 5 days 5 mL Theadora Rama Scales, PA-C      PDMP not reviewed this encounter.  Disposition Upon Discharge:  Condition: stable for discharge home Home: take medications as prescribed; routine discharge instructions as discussed; follow up as advised.  Patient presented with an acute illness with associated systemic symptoms and significant discomfort requiring urgent management. In my opinion, this is a condition that a prudent lay person (someone who possesses an average knowledge of health and medicine) may potentially expect to result in complications if not addressed urgently such as respiratory distress, impairment of bodily function or  dysfunction of bodily organs.   Routine symptom specific, illness specific and/or disease specific instructions were discussed with the patient and/or caregiver at length.   As such, the patient has been evaluated and assessed, work-up was performed and treatment was provided in alignment with urgent care protocols and evidence based medicine.  Patient/parent/caregiver has been advised that the patient may require follow up for further testing and treatment if the symptoms continue in spite of treatment, as clinically indicated and appropriate.  If the patient was tested for COVID-19, Influenza and/or RSV, then the patient/parent/guardian was advised to isolate at home pending the results of his/her diagnostic coronavirus test and potentially longer if they're positive. I have also advised pt that if his/her COVID-19 test returns positive, it's recommended to self-isolate for at least 10 days after symptoms first appeared AND until fever-free for 24 hours without fever reducer AND other symptoms have improved or resolved. Discussed self-isolation recommendations as well as instructions for household member/close contacts as per the [facility] and [place] DHHS, and also gave patient the COVID packet with this information.  Patient/parent/caregiver has been advised to return to the [facility] or PCP in 3-5 days if no better; to PCP or the Emergency Department if new signs and symptoms develop, or if the current signs or symptoms continue to change or worsen for further workup, evaluation and treatment as clinically indicated and appropriate  The patient will follow up with their current PCP if and as advised. If the patient does not currently have a PCP we will assist them in obtaining one.   The patient may need  specialty follow up if the symptoms continue, in spite of conservative treatment and management, for further workup, evaluation, consultation and treatment as clinically indicated and  appropriate.  Patient/parent/caregiver verbalized understanding and agreement of plan as discussed.  All questions were addressed during visit.  Please see discharge instructions below for further details of plan.  Discharge Instructions:   Discharge Instructions      You certainly have conjunctivitis but I cannot 100% say whether it is viral, bacterial or allergic.  For this reason, I recommend that you use combination eyedrop that contains an antibiotic and a steroid for treatment.  Please instill 1 drop in each eye every 4 hours while awake for the next 5 days.  If you do not have improvement of your symptoms after 5 days, please follow-up with your ophthalmologist.      This office note has been dictated using Dragon speech recognition software.  Unfortunately, this method of dictation can sometimes lead to typographical or grammatical errors.  I apologize for your inconvenience in advance if this occurs.  Please do not hesitate to reach out to me if clarification is needed.      Theadora Rama Scales, PA-C 04/30/22 1745

## 2022-04-30 NOTE — Discharge Instructions (Addendum)
You certainly have conjunctivitis but I cannot 100% say whether it is viral, bacterial or allergic.  For this reason, I recommend that you use combination eyedrop that contains an antibiotic and a steroid for treatment.  Please instill 1 drop in each eye every 4 hours while awake for the next 5 days.  If you do not have improvement of your symptoms after 5 days, please follow-up with your ophthalmologist.

## 2022-05-02 ENCOUNTER — Encounter (HOSPITAL_BASED_OUTPATIENT_CLINIC_OR_DEPARTMENT_OTHER): Payer: Medicare Other | Admitting: Physical Therapy

## 2022-07-05 ENCOUNTER — Other Ambulatory Visit: Payer: Self-pay

## 2022-07-05 DIAGNOSIS — I7102 Dissection of abdominal aorta: Secondary | ICD-10-CM

## 2022-07-29 ENCOUNTER — Ambulatory Visit (HOSPITAL_COMMUNITY)
Admission: RE | Admit: 2022-07-29 | Discharge: 2022-07-29 | Disposition: A | Discharge status: Home / Self Care | Payer: Medicare Other | Source: Ambulatory Visit | Attending: Vascular Surgery | Admitting: Vascular Surgery

## 2022-07-29 DIAGNOSIS — I7102 Dissection of abdominal aorta: Secondary | ICD-10-CM | POA: Insufficient documentation

## 2022-07-29 MED ORDER — IOHEXOL 350 MG/ML SOLN
120.0000 mL | Freq: Once | INTRAVENOUS | Status: AC | PRN
  Administered 2022-07-29: 120 mL via INTRAVENOUS

## 2022-08-20 ENCOUNTER — Encounter: Payer: Self-pay | Admitting: Vascular Surgery

## 2022-08-20 ENCOUNTER — Ambulatory Visit: Payer: Medicare Other | Admitting: Vascular Surgery

## 2022-08-20 VITALS — BP 117/68 | HR 72 | Temp 97.8°F | Resp 16 | Ht 71.0 in | Wt 325.0 lb

## 2022-08-20 DIAGNOSIS — I723 Aneurysm of iliac artery: Secondary | ICD-10-CM

## 2022-08-20 DIAGNOSIS — I7102 Dissection of abdominal aorta: Secondary | ICD-10-CM

## 2022-08-20 NOTE — Progress Notes (Signed)
Patient name: Steve [name] MRN: [id number] DOB: May 10, 1946 Sex: male  REASON FOR VISIT: 1 year follow-up for infra-renal aortic dissection/iliac aneurysm  HPI: Steve [name] is a 76 y.o. male with hx HTN that presents for 1 year follow-up of an aortic dissection previously followed by Dr. Arbie Cookey.  Reportedly had an incidental finding of an infrarenal aortic dissection when he saw Dr. Arbie Cookey on 06/01/2019.  At the time of evaluation apparently had diverticulitis with abdominal pain and there was also an incidental finding of an infrarenal aortic dissection.  This has been followed with serial CT scans.  No specific concerns today.  He is having no lower extremity claudication or other symptoms in his legs when he ambulates.  No abdominal pain.  Has gained weight since last visit.  Past Medical History:  Diagnosis Date   Dementia (HCC)    Diverticulitis    Hypercholesteremia    Hypertension    Osteoporosis    Thyroid disease     Past Surgical History:  Procedure Laterality Date   NOSE SURGERY     TRANSURETHRAL RESECTION OF PROSTATE      Family History  Problem Relation Age of Onset   Alzheimer's disease Mother    Cancer Father     SOCIAL HISTORY: Social History   Tobacco Use   Smoking status: Never   Smokeless tobacco: Never  Substance Use Topics   Alcohol use: No    Allergies  Allergen Reactions   Donepezil Diarrhea and Other (See Comments)    Abdominal pain Diarrhea Abdominal pain Diarrhea    Benzonatate Swelling    Tessalon pearls   Penicillin G Rash   Penicillins Rash    Current Outpatient Medications  Medication Sig Dispense Refill   aspirin 325 MG EC tablet Take 325 mg by mouth daily.     cholecalciferol (VITAMIN D) 1000 UNITS tablet Take 1,000 Units by mouth daily.     clorazepate (TRANXENE) 7.5 MG tablet TK 1 T PO BID     cyanocobalamin 100 MCG tablet Take 100 mcg by mouth daily.     fish oil-omega-3 fatty acids 1000 MG capsule Take 1-2 g by mouth 2  (two) times daily. Take 1 capsule in the morning and 2 capsules in the evening     levothyroxine (SYNTHROID) 125 MCG tablet      lisinopril (PRINIVIL,ZESTRIL) 10 MG tablet Take 10 mg by mouth daily.     memantine (NAMENDA) 10 MG tablet      montelukast (SINGULAIR) 10 MG tablet Take 10 mg by mouth at bedtime.     MYRBETRIQ 50 MG TB24 tablet      pantoprazole (PROTONIX) 40 MG tablet      simvastatin (ZOCOR) 40 MG tablet Take 40 mg by mouth every evening.     vitamin C (ASCORBIC ACID) 500 MG tablet Take 500 mg by mouth daily.     tobramycin-dexamethasone (TOBRADEX) ophthalmic solution Place 1 drop into both eyes every 4 (four) hours while awake. For 5 days (Patient not taking: Reported on 08/20/2022) 5 mL 0   traMADol (ULTRAM) 50 MG tablet 1-2 tablets every 6 hours as needed for pain (Patient not taking: Reported on 08/20/2022) 20 tablet 0   No current facility-administered medications for this visit.    REVIEW OF SYSTEMS:  [X]  denotes positive finding, [ ]  denotes negative finding Cardiac  Comments:  Chest pain or chest pressure:    Shortness of breath upon exertion:    Short of breath when lying flat:  Irregular heart rhythm:        Vascular    Pain in calf, thigh, or hip brought on by ambulation:    Pain in feet at night that wakes you up from your sleep:     Blood clot in your veins:    Leg swelling:         Pulmonary    Oxygen at home:    Productive cough:     Wheezing:         Neurologic    Sudden weakness in arms or legs:     Sudden numbness in arms or legs:     Sudden onset of difficulty speaking or slurred speech:    Temporary loss of vision in one eye:     Problems with dizziness:         Gastrointestinal    Blood in stool:     Vomited blood:         Genitourinary    Burning when urinating:     Blood in urine:        Psychiatric    Major depression:         Hematologic    Bleeding problems:    Problems with blood clotting too easily:        Skin     Rashes or ulcers:        Constitutional    Fever or chills:      PHYSICAL EXAM: Vitals:   08/20/22 1455  BP: 117/68  Pulse: 72  Resp: 16  Temp: 97.8 F (36.6 C)  TempSrc: Temporal  SpO2: 92%  Weight: (!) 325 lb (147.4 kg)  Height: 5\' 11"  (1.803 m)    GENERAL: The patient is a well-nourished male, in no acute distress. The vital signs are documented above. CARDIAC: There is a regular rate and rhythm.  VASCULAR: Palpable femoral pulses bilaterally Palpable dorsalis pedis pulses bilaterally PULMONARY: No respiratory distress ABDOMEN: Soft and non-tender.  No significant pain. MUSCULOSKELETAL: There are no major deformities or cyanosis. NEUROLOGIC: No focal weakness or paresthesias are detected.   DATA:   CTA 07/29/2022 on my reviewe shows stable infrarenal aortic dissection that extends into both iliac arteries.  Infrarenal aneurysm is stable at 3.1 cm.  Right common iliac artery measures 3.1 cm and stable as well.  He does have a stable ascending aortic aneurysm at 4.5 cm.  Assessment/Plan:  76 year old male presents for 1 year follow-up for a incidental dissection of the infrarenal abdominal aorta initially evaluated by Dr. Donnetta Hutching.  Repeat CTA 07/29/2022 shows no significant interval change.  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  These have been stable over multiple CTs over the last several years.  Discussed most people would recommend intervention on a common iliac aneurysm greater than 3.5 cm especially with stable growth over multiple years.  He has palpable pedal pulses and no evidence of lower extremity symptoms.  We will plan to see him again in 1 year with CTA chest abdomen pelvis for ongoing surveillance and to monitor for ongoing aneurysmal degeneration.  I also discussed the ascending aortic aneurysm measuring 4.5 cm but this has been stable on multiple prior CT scans  and we can follow on future imaging.   Marty Heck, MD [facility] Office: [phone]

## 2022-12-26 ENCOUNTER — Encounter: Payer: Self-pay | Admitting: Radiology

## 2023-01-27 ENCOUNTER — Other Ambulatory Visit (INDEPENDENT_AMBULATORY_CARE_PROVIDER_SITE_OTHER): Payer: Medicare Other

## 2023-01-27 ENCOUNTER — Ambulatory Visit: Payer: Medicare Other | Admitting: Physician Assistant

## 2023-01-27 ENCOUNTER — Encounter: Payer: Self-pay | Admitting: Physician Assistant

## 2023-01-27 DIAGNOSIS — M25511 Pain in right shoulder: Secondary | ICD-10-CM | POA: Diagnosis not present

## 2023-01-27 MED ORDER — METHYLPREDNISOLONE ACETATE 40 MG/ML IJ SUSP
40.0000 mg | INTRAMUSCULAR | Status: AC | PRN
  Administered 2023-01-27: 40 mg via INTRA_ARTICULAR

## 2023-01-27 MED ORDER — LIDOCAINE HCL 1 % IJ SOLN
3.0000 mL | INTRAMUSCULAR | Status: AC | PRN
  Administered 2023-01-27: 3 mL

## 2023-01-27 NOTE — Progress Notes (Signed)
Office Visit Note   Patient: Steve [name]           Date of Birth: July 05, 1946           MRN: [id number] Visit Date: 01/27/2023              Requested by: Creola Corn, MD [address] [place],  [place] [address] PCP: Creola Corn, MD   Assessment & Plan: Visit Diagnoses:  1. Acute pain of right shoulder     Plan:  See him back in 2 weeks to see how he is doing overall from the injection.  Questions were encouraged and answered at length.  Follow-Up Instructions: No follow-ups on file.   Orders:  Orders Placed This Encounter  Procedures   Large Joint Inj   XR Shoulder Right   No orders of the defined types were placed in this encounter.     Procedures: Large Joint Inj: R subacromial bursa on 01/27/2023 5:28 PM Indications: pain Details: 22 G 1.5 in needle, superior approach  Arthrogram: No  Medications: 3 mL lidocaine 1 %; 40 mg methylPREDNISolone acetate 40 MG/ML Outcome: tolerated well, no immediate complications Procedure, treatment alternatives, risks and benefits explained, specific risks discussed. Consent was given by the patient. Immediately prior to procedure a time out was called to verify the correct patient, procedure, equipment, support staff and site/side marked as required. Patient was prepped and draped in the usual sterile fashion.       Clinical Data: No additional findings.   Subjective: Chief Complaint  Patient presents with   Right Shoulder - Pain    HPI Steve [name] comes in today due to right shoulder pain.  He has had right shoulder pain for 6 weeks.  He states that he was stretching reaching for something and had pain in his right shoulder he points to the anterior aspect of the shoulder biceps and triceps region.  He notes that since that time he has had pain for range of motion of the right shoulder.  He ranks pain to be 5 out of 10 pain.  He is tried Voltaren gel.  He had no prior pain in the shoulder.  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  He did well after that and had no significant adverse effects to this.  Denies any numbness tingling down the arm.  Patient is nondiabetic.  Review of Systems Denies fevers or chills  Objective: Vital Signs: There were no vitals taken for this visit.  Physical Exam Constitutional:      Appearance: He is not ill-appearing or diaphoretic.  Neurological:     Mental Status: He is alert and oriented to person, place, and time.  Psychiatric:        Mood and Affect: Mood normal.     Ortho Exam Bilateral shoulders 5 out of 5 strength with external and internal rotation against resistance.  Well-healed surgical incision over the biceps.  On the right.  No evidence of biceps rupture.  Impingement testing positive on the right negative on the left.  Empty can test is negative bilaterally.  Liftoff test is negative bilaterally. Specialty Comments:  No specialty comments available.  Imaging: XR Shoulder Right  Result Date: 01/27/2023 Right shoulder 3 views: Shoulder is well located.  Mild AC joint changes.  Glenohumeral joint appears well-preserved.  Cystic changes within the humeral shaft but no evidence of  lytic lesions or acute fractures.    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   Senile nuclear sclerosis 03/21/2017   Risk for falls 06/26/2016   Rhinitis, chronic 05/21/2016   Sensorineural hearing loss  (SNHL) of both ears 05/21/2016   Screening for malignant neoplasm of prostate 03/22/2016   Encounter for general adult medical examination without abnormal findings 03/22/2016   Morbid obesity 03/22/2016   Mild cognitive impairment 01/22/2016   Benign non-nodular prostatic 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 lower urinary tract symptoms 01/17/2016   Irritability and anger 10/17/2015   Osteoporosis 10/17/2015   Acquired hypothyroidism 10/16/2015   Diverticulosis of large intestine 10/16/2015   Essential hypertension 10/16/2015   GERD without esophagitis 10/16/2015   Hyperlipidemia LDL goal <100 10/16/2015   Obstructive sleep apnea (adult) (pediatric) 10/16/2015   Frequency 07/29/2014   Past Medical History:  Diagnosis Date   Dementia    Diverticulitis    Hypercholesteremia    Hypertension    Osteoporosis    Thyroid disease     Family History  Problem Relation Age of Onset   Alzheimer's disease Mother    Cancer Father     Past Surgical History:  Procedure Laterality Date   NOSE SURGERY     TRANSURETHRAL RESECTION OF PROSTATE     Social History   Occupational History   Not on file  Tobacco Use   Smoking status: Never   Smokeless tobacco: Never  Vaping Use   Vaping Use: Never used  Substance and Sexual Activity   Alcohol use: No   Drug use: No   Sexual activity: Not on file

## 2023-02-21 ENCOUNTER — Emergency Department (HOSPITAL_BASED_OUTPATIENT_CLINIC_OR_DEPARTMENT_OTHER): Payer: Medicare Other | Admitting: Radiology

## 2023-02-21 ENCOUNTER — Emergency Department (HOSPITAL_BASED_OUTPATIENT_CLINIC_OR_DEPARTMENT_OTHER)
Admission: EM | Admit: 2023-02-21 | Discharge: 2023-02-21 | Disposition: A | Discharge status: Home / Self Care | Payer: Medicare Other | Attending: Emergency Medicine | Admitting: Emergency Medicine

## 2023-02-21 ENCOUNTER — Encounter (HOSPITAL_BASED_OUTPATIENT_CLINIC_OR_DEPARTMENT_OTHER): Payer: Self-pay

## 2023-02-21 ENCOUNTER — Emergency Department (HOSPITAL_BASED_OUTPATIENT_CLINIC_OR_DEPARTMENT_OTHER): Payer: Medicare Other

## 2023-02-21 ENCOUNTER — Other Ambulatory Visit: Payer: Self-pay

## 2023-02-21 DIAGNOSIS — R1013 Epigastric pain: Secondary | ICD-10-CM | POA: Insufficient documentation

## 2023-02-21 DIAGNOSIS — Z7982 Long term (current) use of aspirin: Secondary | ICD-10-CM | POA: Insufficient documentation

## 2023-02-21 LAB — CBC
HCT: 43 % (ref 39.0–52.0)
Hemoglobin: 14.2 g/dL (ref 13.0–17.0)
MCH: 29.6 pg (ref 26.0–34.0)
MCHC: 33 g/dL (ref 30.0–36.0)
MCV: 89.6 fL (ref 80.0–100.0)
Platelets: 247 10*3/uL (ref 150–400)
RBC: 4.8 MIL/uL (ref 4.22–5.81)
RDW: 14.3 % (ref 11.5–15.5)
WBC: 7.1 10*3/uL (ref 4.0–10.5)
nRBC: 0 % (ref 0.0–0.2)

## 2023-02-21 LAB — BASIC METABOLIC PANEL
Anion gap: 10 (ref 5–15)
BUN: 16 mg/dL (ref 8–23)
CO2: 22 mmol/L (ref 22–32)
Calcium: 9.3 mg/dL (ref 8.9–10.3)
Chloride: 105 mmol/L (ref 98–111)
Creatinine, Ser: 0.86 mg/dL (ref 0.61–1.24)
GFR, Estimated: >60 mL/min (ref >60)
Glucose, Bld: 105 mg/dL — ABNORMAL HIGH (ref 70–99)
Potassium: 4.5 mmol/L (ref 3.5–5.1)
Sodium: 137 mmol/L (ref 135–145)

## 2023-02-21 LAB — LIPASE, BLOOD: Lipase: 40 U/L (ref 11–51)

## 2023-02-21 LAB — TROPONIN I (HIGH SENSITIVITY): Troponin I (High Sensitivity): 5 ng/L (ref <18)

## 2023-02-21 LAB — HEPATIC FUNCTION PANEL
ALT: 27 U/L (ref 0–44)
AST: 24 U/L (ref 15–41)
Albumin: 4.2 g/dL (ref 3.5–5.0)
Alkaline Phosphatase: 37 U/L — ABNORMAL LOW (ref 38–126)
Bilirubin, Direct: 0.1 mg/dL (ref 0.0–0.2)
Indirect Bilirubin: 0.3 mg/dL (ref 0.3–0.9)
Total Bilirubin: 0.4 mg/dL (ref 0.3–1.2)
Total Protein: 6.9 g/dL (ref 6.5–8.1)

## 2023-02-21 MED ORDER — ALUM & MAG HYDROXIDE-SIMETH 200-200-20 MG/5ML PO SUSP
30.0000 mL | Freq: Once | ORAL | Status: AC
  Administered 2023-02-21: 30 mL via ORAL
  Filled 2023-02-21: qty 30

## 2023-02-21 NOTE — ED Triage Notes (Signed)
Patient arrives with complaints of worsening abdominal pain that radiated up his chest, which felt like heartburn. Patient also reports some shortness of breath as well.   Rates discomfort a 5/10.

## 2023-02-21 NOTE — ED Provider Notes (Signed)
Steve [name]   CSN: [id number] Arrival date & time: 02/21/23  1302     History  Chief Complaint  Patient presents with   Chest Pain   Abdominal Pain    Steve [name] is a 77 y.o. male.  77 yo M with a cc of abdominal pain and then now more epigastric pain.  This has been going on for couple days.  After some thought he realized it occurred after he ate at [facility].  Seem to be worse when he laid back flat it was much worse last night.  This morning it persisted feels like a burning in his chest.  Nothing seems to make it better or worse.  Denies any worsening with exertion.  Denies fevers denies vomiting denies cough.   Chest Pain Associated symptoms: abdominal pain   Abdominal Pain Associated symptoms: chest pain        Home Medications Prior to Admission medications   Medication Sig Start Date End Date Taking? Authorizing Provider  aspirin 325 MG EC tablet Take 325 mg by mouth daily.    [provider]  cholecalciferol (VITAMIN D) 1000 UNITS tablet Take 1,000 Units by mouth daily.    [provider]  clorazepate (TRANXENE) 7.5 MG tablet TK 1 T PO BID 05/11/19   [provider]  cyanocobalamin 100 MCG tablet Take 100 mcg by mouth daily.    [provider]  fish oil-omega-3 fatty acids 1000 MG capsule Take 1-2 g by mouth 2 (two) times daily. Take 1 capsule in the morning and 2 capsules in the evening    [provider]  levothyroxine (SYNTHROID) 125 MCG tablet  04/21/19   [provider]  lisinopril (PRINIVIL,ZESTRIL) 10 MG tablet Take 10 mg by mouth daily.    [provider]  memantine (NAMENDA) 10 MG tablet  05/22/19   [provider]  montelukast (SINGULAIR) 10 MG tablet Take 10 mg by mouth at bedtime.    [provider]  MYRBETRIQ 50 MG TB24 tablet  03/05/19   [provider]  pantoprazole (PROTONIX) 40 MG tablet  03/22/19   [provider]  simvastatin (ZOCOR) 40 MG tablet Take 40 mg by mouth every evening.    [provider]  tobramycin-dexamethasone Wallene Dales) ophthalmic solution Place 1 drop into both eyes every 4 (four) hours while awake. For 5 days Patient not taking: Reported on 08/20/2022 04/30/22   Theadora Rama Scales, PA-C  traMADol Janean Sark) 50 MG tablet 1-2 tablets every 6 hours as needed for pain Patient not taking: Reported on 08/20/2022 12/30/21   Arby Barrette, MD  vitamin C (ASCORBIC ACID) 500 MG tablet Take 500 mg by mouth daily.    [provider]      Allergies    Donepezil, Benzonatate, Penicillin g, and Penicillins    Review of Systems   Review of Systems  Cardiovascular:  Positive for chest pain.  Gastrointestinal:  Positive for abdominal pain.    Physical Exam Updated Vital Signs BP 125/73   Pulse 75   Temp 98.3 F (36.8 C)   Resp 20   Ht 6' (1.829 m)   Wt (!) 144.2 kg   SpO2 92%   BMI 43.13 kg/m  Physical Exam Vitals and nursing [name] reviewed.  Constitutional:      Appearance: He is well-developed.  HENT:     Head: Normocephalic and atraumatic.  Eyes:     Pupils: Pupils are equal, round, and  reactive to light.  Neck:     Vascular: No JVD.  Cardiovascular:     Rate and Rhythm: Normal rate and regular rhythm.     Heart sounds: No murmur heard.    No friction rub. No gallop.  Pulmonary:     Effort: No respiratory distress.     Breath sounds: No wheezing.  Abdominal:     General: There is no distension.     Tenderness: There is no abdominal tenderness. There is no guarding or rebound.     Comments: Benign abdominal exam  Musculoskeletal:        General: Normal range of motion.     Cervical back: Normal range of motion and neck supple.  Skin:    Coloration: Skin is not pale.     Findings: No rash.  Neurological:     Mental Status: He is alert and oriented to person, place, and time.  Psychiatric:        Behavior: Behavior normal.      ED Results / Procedures / Treatments   Labs (all labs ordered are listed, but only abnormal results are displayed) Labs Reviewed  BASIC METABOLIC PANEL - Abnormal; Notable for the following components:      Result Value   Glucose, Bld 105 (*)    All other components within normal limits  HEPATIC FUNCTION PANEL - Abnormal; Notable for the following components:   Alkaline Phosphatase 37 (*)    All other components within normal limits  CBC  LIPASE, BLOOD  TROPONIN I (HIGH SENSITIVITY)    EKG EKG Interpretation  Date/Time:  Friday Feb 21 2023 13:20:57 EDT Ventricular Rate:  78 PR Interval:  164 QRS Duration: 98 QT Interval:  390 QTC Calculation: 444 R Axis:   48 Text Interpretation: Normal sinus rhythm Isolated V3 t wave flattening with lower voltage as compared to first previous. No STEMII Abnormal ECG When compared with ECG of 30-Dec-2021 19:31, Confirmed by Arby Barrette [phone]) on 02/21/2023 2:26:59 PM  Radiology DG Chest 2 View  Result Date: 02/21/2023 CLINICAL DATA:  Chest pain EXAM: CHEST - 2 VIEW COMPARISON:  CXR 12/30/21 FINDINGS: No pleural effusion. No pneumothorax. Normal cardiac and mediastinal contours. No radiographically apparent displaced rib fractures. Hazy opacity at the left lung base could represent atelectasis or infection. Vertebral body are maintained. IMPRESSION: No focal airspace opacity. Electronically Signed   By: Lorenza Cambridge M.D.   On: 02/21/2023 14:02    Procedures Procedures    Medications Ordered in ED Medications  alum & mag hydroxide-simeth (MAALOX/MYLANTA) 200-200-20 MG/5ML suspension 30 mL (30 mLs Oral Given 02/21/23 1612)    ED Course/ Medical Decision Making/ A&P                             Medical Decision Making Amount and/or Complexity of Data Reviewed Labs: ordered.  Risk OTC drugs.   77 yo M with a chief complaints of abdominal pain that migrated into the chest and the base of the throat.  Feels like a burning.   Occurred after he ate at [facility].  He has no exertional symptoms.  Troponins negative.  Symptoms have been going on greater than 6 hours without significant change.  LFTs and lipase without significant finding.  Discharge home.  Treat as possible reflux.  PCP follow-up.  4:55 PM:  I have discussed the diagnosis/risks/treatment options with the patient and family.  Evaluation and diagnostic testing in the emergency department  does not suggest an emergent condition requiring admission or immediate intervention beyond what has been performed at this time.  They will follow up with PCP. We also discussed returning to the ED immediately if new or worsening sx occur. We discussed the sx which are most concerning (e.g., sudden worsening pain, fever, inability to tolerate by mouth) that necessitate immediate return. Medications administered to the patient during their visit and any new prescriptions provided to the patient are listed below.  Medications given during this visit Medications  alum & mag hydroxide-simeth (MAALOX/MYLANTA) 200-200-20 MG/5ML suspension 30 mL (30 mLs Oral Given 02/21/23 1612)     The patient appears reasonably screen and/or stabilized for discharge and I doubt any other medical condition or other [place] requiring further screening, evaluation, or treatment in the ED at this time prior to discharge.          Final Clinical Impression(s) / ED Diagnoses Final diagnoses:  Epigastric pain    Rx / DC Orders ED Discharge Orders     None         Melene Plan, DO 02/21/23 1655

## 2023-02-21 NOTE — Discharge Instructions (Signed)
Try pepcid or tagamet up to twice a day.  Try to avoid things that may make this worse, most commonly these are spicy foods tomato based products fatty foods chocolate and peppermint.  Alcohol and tobacco can also make this worse.  Return to the emergency department for sudden worsening pain fever or inability to eat or drink.  Try not to eat or drink anything including water 4 hours before you lay down.

## 2023-02-26 ENCOUNTER — Other Ambulatory Visit: Payer: Self-pay | Admitting: Internal Medicine

## 2023-02-26 DIAGNOSIS — R1013 Epigastric pain: Secondary | ICD-10-CM

## 2023-03-10 ENCOUNTER — Ambulatory Visit
Admission: RE | Admit: 2023-03-10 | Discharge: 2023-03-10 | Disposition: A | Discharge status: Home / Self Care | Payer: Medicare Other | Source: Ambulatory Visit | Attending: Internal Medicine | Admitting: Internal Medicine

## 2023-03-10 DIAGNOSIS — R1013 Epigastric pain: Secondary | ICD-10-CM

## 2023-03-28 ENCOUNTER — Other Ambulatory Visit: Payer: Self-pay | Admitting: Internal Medicine

## 2023-03-28 DIAGNOSIS — K219 Gastro-esophageal reflux disease without esophagitis: Secondary | ICD-10-CM

## 2023-03-28 DIAGNOSIS — R1013 Epigastric pain: Secondary | ICD-10-CM

## 2023-04-04 ENCOUNTER — Ambulatory Visit
Admission: RE | Admit: 2023-04-04 | Discharge: 2023-04-04 | Disposition: A | Discharge status: Home / Self Care | Payer: Medicare Other | Source: Ambulatory Visit | Attending: Internal Medicine | Admitting: Internal Medicine

## 2023-04-04 DIAGNOSIS — R1013 Epigastric pain: Secondary | ICD-10-CM

## 2023-04-04 DIAGNOSIS — K219 Gastro-esophageal reflux disease without esophagitis: Secondary | ICD-10-CM

## 2023-07-02 ENCOUNTER — Other Ambulatory Visit (HOSPITAL_COMMUNITY): Payer: Self-pay | Admitting: *Deleted

## 2023-07-03 ENCOUNTER — Ambulatory Visit (HOSPITAL_COMMUNITY)
Admission: RE | Admit: 2023-07-03 | Discharge: 2023-07-03 | Disposition: A | Discharge status: Home / Self Care | Payer: Medicare Other | Source: Ambulatory Visit | Attending: Internal Medicine | Admitting: Internal Medicine

## 2023-07-03 DIAGNOSIS — M810 Age-related osteoporosis without current pathological fracture: Secondary | ICD-10-CM | POA: Diagnosis present

## 2023-07-03 MED ORDER — ZOLEDRONIC ACID 5 MG/100ML IV SOLN
5.0000 mg | Freq: Once | INTRAVENOUS | Status: AC
  Administered 2023-07-03: 5 mg via INTRAVENOUS

## 2023-07-03 MED ORDER — ZOLEDRONIC ACID 5 MG/100ML IV SOLN
INTRAVENOUS | Status: AC
  Filled 2023-07-03: qty 100

## 2023-07-10 ENCOUNTER — Other Ambulatory Visit: Payer: Self-pay

## 2023-07-10 DIAGNOSIS — I7102 Dissection of abdominal aorta: Secondary | ICD-10-CM

## 2023-07-10 DIAGNOSIS — I723 Aneurysm of iliac artery: Secondary | ICD-10-CM

## 2023-07-29 ENCOUNTER — Ambulatory Visit: Payer: Medicare Other | Attending: Cardiovascular Disease | Admitting: Cardiology

## 2023-07-29 VITALS — BP 112/74 | HR 68 | Ht 71.0 in | Wt 319.4 lb

## 2023-07-29 DIAGNOSIS — Z131 Encounter for screening for diabetes mellitus: Secondary | ICD-10-CM

## 2023-07-29 DIAGNOSIS — I10 Essential (primary) hypertension: Secondary | ICD-10-CM

## 2023-07-29 DIAGNOSIS — R0789 Other chest pain: Secondary | ICD-10-CM | POA: Diagnosis not present

## 2023-07-29 DIAGNOSIS — R072 Precordial pain: Secondary | ICD-10-CM

## 2023-07-29 DIAGNOSIS — R1013 Epigastric pain: Secondary | ICD-10-CM | POA: Diagnosis not present

## 2023-07-29 DIAGNOSIS — I7102 Dissection of abdominal aorta: Secondary | ICD-10-CM

## 2023-07-29 DIAGNOSIS — E785 Hyperlipidemia, unspecified: Secondary | ICD-10-CM

## 2023-07-29 MED ORDER — METOPROLOL TARTRATE 50 MG PO TABS
ORAL_TABLET | ORAL | 0 refills | Status: AC
Start: 2023-07-29 — End: ?

## 2023-07-29 NOTE — Patient Instructions (Addendum)
Medication Instructions:  Continue all current medications *If you need a refill on your cardiac medications before your next appointment, please call your pharmacy*   Lab Work: BMET, Lipid, A1C Today If you have labs (blood work) drawn today and your tests are completely normal, you will receive your results only by: MyChart Message (if you have MyChart) OR A paper copy in the mail If you have any lab test that is abnormal or we need to change your treatment, we will call you to review the results.   Testing/Procedures: Echo     Follow-Up: At [facility], you and your health needs are our priority.  As part of our continuing mission to provide you with exceptional heart care, we have created designated Provider Care Teams.  These Care Teams include your primary Cardiologist (physician) and Advanced Practice Providers (APPs -  Physician Assistants and Nurse Practitioners) who all work together to provide you with the care you need, when you need it.  We recommend signing up for the patient portal called "MyChart".  Sign up information is provided on this After Visit Summary.  MyChart is used to connect with patients for Virtual Visits (Telemedicine).  Patients are able to view lab/test results, encounter notes, upcoming appointments, etc.  Non-urgent messages can be sent to your provider as well.   To learn more about what you can do with MyChart, go to [email or web address].    Your next appointment:   4 month(s)  Provider:   Dr. Bjorn Pippin      Your cardiac CT will be scheduled at one of the below locations:   [facility] [address] [place], [place] [address] [phone]  OR  [facility] [address] [place], [place] [address] [phone]  OR   [facility] [address] [place], [place] [address] [phone]  If scheduled at [facility], please arrive  at the [facility] Entrance (Entrance C2) of [facility] 30 minutes prior to test start time. You can use the FREE valet parking offered at entrance C (encouraged to control the heart rate for the test)  Proceed to the [facility] Radiology Department (first floor) to check-in and test prep.  All radiology patients and guests should use entrance C2 at [facility], accessed from [facility], even though the hospital's physical address listed is [address]    If scheduled at [facility] or [facility], please arrive 15 mins early for check-in and test prep.  There is spacious parking and easy access to the radiology department from the [facility] Vascular entrance. Please enter here and check-in with the desk attendant.   Please follow these instructions carefully (unless otherwise directed):  An IV will be required for this test and Nitroglycerin will be given.  Hold all erectile dysfunction medications at least 3 days (72 hrs) prior to test. (Ie viagra, cialis, sildenafil, tadalafil, etc)   On the Night Before the Test: Be sure to Drink plenty of water. Do not consume any caffeinated/decaffeinated beverages or chocolate 12 hours prior to your test. Do not take any antihistamines 12 hours prior to your test.  On the Day of the Test: Drink plenty of water until 1 hour prior to the test. Do not eat any food 1 hour prior to test. You may take your regular medications prior to the test.  Take metoprolol 50 mg  two hours prior to test.     After the Test: Drink plenty of water. After receiving IV contrast, you may experience a mild flushed feeling. This is normal. On occasion, you may experience a mild rash up to 24 hours after the test. This is not dangerous. If this occurs, you can take Benadryl 25 mg and increase your fluid intake. If you experience trouble breathing, this can be  serious. If it is severe call 911 IMMEDIATELY. If it is mild, please call our office.   We will call to schedule your test 2-4 weeks out understanding that some insurance companies will need an authorization prior to the service being performed.   For more information and frequently asked questions, please visit our website : [email or web address]  For non-scheduling related questions, please contact the cardiac imaging nurse navigator should you have any questions/concerns: Cardiac Imaging Nurse Navigators Direct Office Dial: [phone]   For scheduling needs, including cancellations and rescheduling, please call [place], [phone].

## 2023-07-29 NOTE — Progress Notes (Signed)
Cardiology Office Note:    Date:  08/03/2023   ID:  Steve [name], DOB 12/21/1945, MRN [id number]  PCP:  Creola Corn, MD  Cardiologist:  Little Ishikawa, MD  Electrophysiologist:  None   Referring MD: Lynann Bologna, DO   Chief Complaint  Patient presents with   Chest Pain         History of Present Illness:    Steve [name] is a 77 y.o. male with a hx of hypertension, hyperlipidemia, hypothyroidism, aortic dissection, OSA on CPAP who is referred by Dr. Lorenso Quarry for evaluation of chest pain.  Follows with Dr. Chestine Spore for aortic dissection, last seen 07/2022.  Had an incidental finding of infrarenal aortic dissection 05/2020.  He had presented with diverticulitis and had evidence of dental finding of infrarenal aortic dissection.  Also with stable 3.1 cm infrarenal abdominal aortic aneurysm.  Reports had chest pain after eating frosty at [facility].  Went to ED, troponins were negative.  Reports dyspnea with exertion, particularly with walking up stairs or hill.  Denies any lightheadedness, syncope, lower extremity edema, or palpitations.  No smoking history.  Denies any family history of heart disease.     Past Medical History:  Diagnosis Date   Dementia (HCC)    Diverticulitis    Hypercholesteremia    Hypertension    Osteoporosis    Thyroid disease     Past Surgical History:  Procedure Laterality Date   NOSE SURGERY     TRANSURETHRAL RESECTION OF PROSTATE      Current Medications: Current Meds  Medication Sig   ALPRAZolam (XANAX) 0.25 MG tablet Take 0.25 mg by mouth 2 (two) times daily as needed.   Ascorbic Acid (VITAMIN C) 1000 MG tablet Take 500 mg by mouth daily.   aspirin EC 81 MG tablet Take 325 mg by mouth daily.   B Complex Vitamins (VITAMIN B-COMPLEX) TABS Take 1 tablet by mouth daily.   cholecalciferol (VITAMIN D) 1000 UNITS tablet Take 1,000 Units by mouth daily.   cyanocobalamin 100 MCG tablet Take 100 mcg by mouth daily.   escitalopram  (LEXAPRO) 10 MG tablet Take 10 mg by mouth daily.   fish oil-omega-3 fatty acids 1000 MG capsule Take 1-2 g by mouth 2 (two) times daily. Take 1 capsule in the morning and 2 capsules in the evening   fluticasone (FLONASE) 50 MCG/ACT nasal spray Place 2 sprays into both nostrils daily.   levothyroxine (SYNTHROID) 125 MCG tablet    lisinopril (PRINIVIL,ZESTRIL) 10 MG tablet Take 10 mg by mouth daily.   memantine (NAMENDA) 10 MG tablet    metoprolol tartrate (LOPRESSOR) 50 MG tablet Take 50 mg 2 hrs before Coronary CT   montelukast (SINGULAIR) 10 MG tablet Take 10 mg by mouth at bedtime.   MYRBETRIQ 50 MG TB24 tablet    pantoprazole (PROTONIX) 40 MG tablet    simvastatin (ZOCOR) 40 MG tablet Take 40 mg by mouth every evening.   sucralfate (CARAFATE) 1 g tablet Take 1 g by mouth 2 (two) times daily as needed.   zoledronic acid (RECLAST) 5 MG/100ML SOLN injection Inject 5 mg into the vein once.     Allergies:   Donepezil, Benzonatate, Penicillin g, and Penicillins   Social History   Socioeconomic History   Marital status: Married    Spouse name: Not on file   Number of children: Not on file   Years of education: Not on file   Highest education level: Not on file  Occupational History  Not on file  Tobacco Use   Smoking status: Never   Smokeless tobacco: Never  Vaping Use   Vaping status: Never Used  Substance and Sexual Activity   Alcohol use: No   Drug use: No   Sexual activity: Not on file  Other Topics Concern   Not on file  Social History Narrative   Not on file   Social Determinants of Health   Financial Resource Strain: Not on file  Food Insecurity: No Food Insecurity (11/22/2020)   Received from [facility], [facility]   Hunger Vital Sign    Worried About Running Out of Food in the Last Year: Never true    Ran Out of Food in the Last Year: Never true  Transportation Needs: Not on file  Physical Activity: Not on file  Stress: Not on file  Social Connections:  Unknown (03/04/2022)   Received from [facility], [facility]   Social Network    Social Network: Not on file     Family History: The patient's family history includes Alzheimer's disease in his mother; Cancer in his father.  ROS:   Please see the history of present illness.     All other systems reviewed and are negative.  EKGs/Labs/Other Studies Reviewed:    The following studies were reviewed today:   EKG:   07/29/23: Normal sinus rhythm, rate 69, no ST abnormalities  Recent Labs: 02/21/2023: ALT 27; Hemoglobin 14.2; Platelets 247 07/29/2023: BUN 15; Creatinine, Ser 0.97; Potassium 5.0; Sodium 140  Recent Lipid Panel    Component Value Date/Time   CHOL 136 07/29/2023 1610   TRIG 195 (H) 07/29/2023 1610   HDL 32 (L) 07/29/2023 1610   CHOLHDL 4.3 07/29/2023 1610   LDLCALC 71 07/29/2023 1610    Physical Exam:    VS:  BP 112/74   Pulse 68   Ht 5\' 11"  (1.803 m)   Wt (!) 319 lb 6.4 oz (144.9 kg)   SpO2 95%   BMI 44.55 kg/m     Wt Readings from Last 3 Encounters:  07/29/23 (!) 319 lb 6.4 oz (144.9 kg)  02/21/23 (!) 318 lb (144.2 kg)  08/20/22 (!) 325 lb (147.4 kg)     GEN:  Well nourished, well developed in no acute distress HEENT: Normal NECK: No JVD; No carotid bruits LYMPHATICS: No lymphadenopathy CARDIAC: RRR, no murmurs, rubs, gallops RESPIRATORY:  Clear to auscultation without rales, wheezing or rhonchi  ABDOMEN: Soft, non-tender, non-distended MUSCULOSKELETAL:  No edema; No deformity  SKIN: Warm and dry NEUROLOGIC:  Alert and oriented x 3 PSYCHIATRIC:  Normal affect   ASSESSMENT:    1. Atypical chest pain   2. Epigastric pain   3. Precordial pain   4. Diabetes mellitus screening   5. Dissection of abdominal aorta (HCC)   6. Essential hypertension   7. Hyperlipidemia, unspecified hyperlipidemia type    PLAN:    Chest pain: Atypical description but does have multiple CAD risk factors (age, hypertension, hyperlipidemia).  Also having dyspnea on  exertion that could represent anginal equivalent.  Recommend coronary CTA to evaluate for obstructive CAD.  Will give Lopressor 50 mg prior to study  Aortic dissection: Follows with Dr. Chestine Spore for aortic dissection, last seen 07/2022.  Had an incidental finding of infrarenal aortic dissection 05/2020.  He had presented with diverticulitis and had evidence of incidental finding of infrarenal aortic dissection.  Also with stable 3.1 cm infrarenal abdominal aortic aneurysm.  Ascending aortic aneurysm: Measured 4.5 cm on CTA 07/2022,  monitoring -Check echocardiogram to evaluate for AI  Hypertension: On lisinopril 10 mg daily.  Appears controlled  Hyperlipidemia: On simvastatin 40 mg daily.  Check lipid panel  OSA: reports compliance with CPAP  RTC in 4 months  Medication Adjustments/Labs and Tests Ordered: Current medicines are reviewed at length with the patient today.  Concerns regarding medicines are outlined above.  Orders Placed This Encounter  Procedures   CT CORONARY MORPH W/CTA COR W/SCORE W/CA W/CM &/OR WO/CM   Basic metabolic panel   Lipid panel   HgB A1c   EKG 12-Lead   ECHOCARDIOGRAM COMPLETE   Meds ordered this encounter  Medications   metoprolol tartrate (LOPRESSOR) 50 MG tablet    Sig: Take 50 mg 2 hrs before Coronary CT    Dispense:  1 tablet    Refill:  0    Patient Instructions  Medication Instructions:  Continue all current medications *If you need a refill on your cardiac medications before your next appointment, please call your pharmacy*   Lab Work: BMET, Lipid, A1C Today If you have labs (blood work) drawn today and your tests are completely normal, you will receive your results only by: MyChart Message (if you have MyChart) OR A paper copy in the mail If you have any lab test that is abnormal or we need to change your treatment, we will call you to review the results.   Testing/Procedures: Echo     Follow-Up: At [facility], you and  your health needs are our priority.  As part of our continuing mission to provide you with exceptional heart care, we have created designated Provider Care Teams.  These Care Teams include your primary Cardiologist (physician) and Advanced Practice Providers (APPs -  Physician Assistants and Nurse Practitioners) who all work together to provide you with the care you need, when you need it.  We recommend signing up for the patient portal called "MyChart".  Sign up information is provided on this After Visit Summary.  MyChart is used to connect with patients for Virtual Visits (Telemedicine).  Patients are able to view lab/test results, encounter notes, upcoming appointments, etc.  Non-urgent messages can be sent to your provider as well.   To learn more about what you can do with MyChart, go to [email or web address].    Your next appointment:   4 month(s)  Provider:   Dr. Bjorn Pippin      Your cardiac CT will be scheduled at one of the below locations:   [facility] [address] [place], [place] [address] [phone]  OR  [facility] [address] [place], [place] [address] [phone]  OR   [facility] [address] [place], [place] [address] [phone]  If scheduled at [facility], please arrive at the [facility] En[facility]ance (Entrance C2) of [facility] 30 minutes prior to test start time. You can use the FREE valet parking offered at entrance C (encouraged to control the heart rate for the test)  Proceed to the [facility] Radiology Department (first floor) to check-in and test prep.  All radiology patients and guests should use entrance C2 at [facility], accessed from [facility], even though the hospital's physical address listed is [address]    If scheduled at [facility] or [facility], please arrive 15 mins early for check-in and test prep.  There is spacious parking and  easy access to the radiology department from the [facility] entrance. Please enter here and check-in with the desk attendant.   Please follow these instructions carefully (unless otherwise directed):  An IV will be required for this test and Nitroglycerin will be given.  Hold all erectile dysfunction medications at least 3 days (72 hrs) prior to test. (Ie viagra, cialis, sildenafil, tadalafil, etc)   On the Night Before the Test: Be sure to Drink plenty of water. Do not consume any caffeinated/decaffeinated beverages or chocolate 12 hours prior to your test. Do not take any antihistamines 12 hours prior to your test.  On the Day of the Test: Drink plenty of water until 1 hour prior to the test. Do not eat any food 1 hour prior to test. You may take your regular medications prior to the test.  Take metoprolol 50 mg  two hours prior to test.     After the Test: Drink plenty of water. After receiving IV contrast, you may experience a mild flushed feeling. This is normal. On occasion, you may experience a mild rash up to 24 hours after the test. This is not dangerous. If this occurs, you can take Benadryl 25 mg and increase your fluid intake. If you experience trouble breathing, this can be serious. If it is severe call 911 IMMEDIATELY. If it is mild, please call our office.   We will call to schedule your test 2-4 weeks out understanding that some insurance companies will need an authorization prior to the service being performed.   For more information and frequently asked questions, please visit our website : [email or web address]  For non-scheduling related questions, please contact the cardiac imaging nurse navigator should you have any questions/concerns: Cardiac Imaging Nurse Navigators Direct Office Dial: [phone]   For scheduling needs,  including cancellations and rescheduling, please call [place], [phone].        Signed, Little Ishikawa, MD  08/03/2023 10:17 PM    [facility]

## 2023-07-30 LAB — HEMOGLOBIN A1C
Est. average glucose Bld gHb Est-mCnc: 123 mg/dL
Hgb A1c MFr Bld: 5.9 % — ABNORMAL HIGH (ref 4.8–5.6)

## 2023-07-30 LAB — LIPID PANEL
Chol/HDL Ratio: 4.3 {ratio} (ref 0.0–5.0)
Cholesterol, Total: 136 mg/dL (ref 100–199)
HDL: 32 mg/dL — ABNORMAL LOW (ref >39)
LDL Chol Calc (NIH): 71 mg/dL (ref 0–99)
Triglycerides: 195 mg/dL — ABNORMAL HIGH (ref 0–149)
VLDL Cholesterol Cal: 33 mg/dL (ref 5–40)

## 2023-07-30 LAB — BASIC METABOLIC PANEL
BUN/Creatinine Ratio: 15 (ref 10–24)
BUN: 15 mg/dL (ref 8–27)
CO2: 21 mmol/L (ref 20–29)
Calcium: 9.4 mg/dL (ref 8.6–10.2)
Chloride: 102 mmol/L (ref 96–106)
Creatinine, Ser: 0.97 mg/dL (ref 0.76–1.27)
Glucose: 96 mg/dL (ref 70–99)
Potassium: 5 mmol/L (ref 3.5–5.2)
Sodium: 140 mmol/L (ref 134–144)
eGFR: 80 mL/min/{1.73_m2} (ref >59)

## 2023-08-05 ENCOUNTER — Ambulatory Visit
Admission: RE | Admit: 2023-08-05 | Discharge: 2023-08-05 | Disposition: A | Discharge status: Home / Self Care | Payer: Medicare Other | Source: Ambulatory Visit | Attending: Vascular Surgery | Admitting: Vascular Surgery

## 2023-08-05 DIAGNOSIS — I723 Aneurysm of iliac artery: Secondary | ICD-10-CM

## 2023-08-05 DIAGNOSIS — I7102 Dissection of abdominal aorta: Secondary | ICD-10-CM

## 2023-08-05 MED ORDER — IOPAMIDOL (ISOVUE-370) INJECTION 76%
500.0000 mL | Freq: Once | INTRAVENOUS | Status: AC | PRN
  Administered 2023-08-05: 100 mL via INTRAVENOUS

## 2023-08-07 ENCOUNTER — Ambulatory Visit: Payer: Medicare Other | Admitting: Physician Assistant

## 2023-08-07 ENCOUNTER — Encounter (HOSPITAL_COMMUNITY): Payer: Self-pay

## 2023-08-07 ENCOUNTER — Other Ambulatory Visit (INDEPENDENT_AMBULATORY_CARE_PROVIDER_SITE_OTHER): Payer: Medicare Other

## 2023-08-07 ENCOUNTER — Encounter: Payer: Self-pay | Admitting: Physician Assistant

## 2023-08-07 DIAGNOSIS — M545 Low back pain, unspecified: Secondary | ICD-10-CM

## 2023-08-07 MED ORDER — TRAMADOL HCL 50 MG PO TABS: 50.0000 mg | ORAL_TABLET | Freq: Four times a day (QID) | ORAL | 0 refills | Status: AC | PRN

## 2023-08-07 MED ORDER — TIZANIDINE HCL 2 MG PO TABS: 2.0000 mg | ORAL_TABLET | Freq: Every day | ORAL | 1 refills | Status: DC

## 2023-08-07 NOTE — Progress Notes (Signed)
Office Visit Note   Patient: Bishoy Cupp           Date of Birth: [birth year]           MRN: [id number] Visit Date: 08/07/2023              Requested by: Creola Corn, MD [address] [place],  [place] [address] PCP: Creola Corn, MD   Assessment & Plan: Visit Diagnoses:  1. Acute right-sided low back pain without sciatica     Plan: Given tramadol to take for pain.  Zanaflex and take it at night.  Will send him formal physical therapy for core strengthening, stretching, home exercises program and modalities.  He will follow-up with Dr. Magnus Ivan in 6 weeks see how he is doing overall.  Follow-up sooner if his condition changes.  Follow-Up Instructions: Return in about 6 weeks (around 09/18/2023).   Orders:  Orders Placed This Encounter  Procedures   XR Lumbar Spine 2-3 Views   Meds ordered this encounter  Medications   tiZANidine (ZANAFLEX) 2 MG tablet    Sig: Take 1 tablet (2 mg total) by mouth at bedtime.    Dispense:  30 tablet    Refill:  1   traMADol (ULTRAM) 50 MG tablet    Sig: Take 1 tablet (50 mg total) by mouth every 6 (six) hours as needed.    Dispense:  30 tablet    Refill:  0      Procedures: No procedures performed   Clinical Data: No additional findings.   Subjective: Chief Complaint  Patient presents with   Right Hip - Pain    HPI Mr. Schnapp comes in today for right low back pain that radiates down the lateral aspect of his right leg to just above the knee.  Describes the pain as a sharp pain.  Does not awaken him.  No numbness tingling.  No change in weight.  Does note increasing urination but no other bowel or bladder dysfunction.  States that the pains been ongoing for the last 4 to 6 weeks no injury.  Recently had blood work his hemoglobin A1c is 5.9 he was told this indicates that he is prediabetic.  Ranks his back pain to be 10 out of 10 pain at worst.  He states that he is unable to stand for a long period of time but is unsure of  why.  Review of Systems  Constitutional:  Negative for chills and fever.  Genitourinary:  Positive for frequency.  Musculoskeletal:  Positive for back pain.  Neurological:  Negative for numbness.     Objective: Vital Signs: There were no vitals taken for this visit.  Physical Exam Constitutional:      Appearance: He is not ill-appearing or diaphoretic.  Cardiovascular:     Pulses: Normal pulses.  Pulmonary:     Effort: Pulmonary effort is normal.  Neurological:     Mental Status: He is alert and oriented to person, place, and time.  Psychiatric:        Mood and Affect: Mood normal.     Ortho Exam Bilateral hips excellent range of motion without pain.  Nontender over the trochanteric region.  Lumbar spine: Positive straight leg raise on the right negative on the left.  Tight hamstrings bilaterally.  Comes within 6 inches of being able to touch his toes.  Limited extension lumbar spine.  Tenderness over the right lower lumbar paraspinous region with palpation.  Out of 5 strength throughout lower extremities except  for extension of the right great toe against resistance which is 4 out of 5.  Sensation grossly intact bilateral feet light touch throughout. Specialty Comments:  No specialty comments available.  Imaging: XR Lumbar Spine 2-3 Views  Result Date: 08/07/2023 Lumbar spine 2 views: Loss of lordotic curvature.  No spondylolisthesis.  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  Scoliosis present.  No acute fractures.    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/2021   Aortic dissection (HCC) 08/15/2020   Bilateral hearing loss 02/28/2020   Vertigo 02/28/2020   Gait difficulty 02/16/2018   Idiopathic peripheral  neuropathy 02/16/2018   Memory loss 02/16/2018   Mixed dementia (HCC) 02/16/2018   Senile nuclear sclerosis 03/21/2017   Risk for falls 06/26/2016   Rhinitis, chronic 05/21/2016   Sensorineural hearing loss (SNHL) of both ears 05/21/2016   Screening for malignant neoplasm of prostate 03/22/2016   Encounter for general adult medical examination without abnormal findings 03/22/2016   Morbid obesity (HCC) 03/22/2016   Mild cognitive impairment 01/22/2016   Benign non-nodular prostatic 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 lower urinary tract symptoms 01/17/2016   Irritability and anger 10/17/2015   Osteoporosis 10/17/2015   Acquired hypothyroidism 10/16/2015   Diverticulosis of large intestine 10/16/2015   Essential hypertension 10/16/2015   GERD without esophagitis 10/16/2015   Hyperlipidemia LDL goal <100 10/16/2015   Obstructive sleep apnea (adult) (pediatric) 10/16/2015   Frequency 07/29/2014   Past Medical History:  Diagnosis Date   Dementia (HCC)    Diverticulitis    Hypercholesteremia    Hypertension    Osteoporosis    Thyroid disease     Family History  Problem Relation Age of Onset   Alzheimer's disease Mother    Cancer Father     Past Surgical History:  Procedure Laterality Date   NOSE SURGERY     TRANSURETHRAL RESECTION OF PROSTATE     Social History   Occupational History   Not on file  Tobacco Use   Smoking status: Never   Smokeless tobacco: Never  Vaping Use   Vaping status: Never Used  Substance and Sexual Activity   Alcohol use: No   Drug use: No   Sexual activity: Not on file

## 2023-08-07 NOTE — Addendum Note (Signed)
Addended by: Mardene Celeste B on: 08/07/2023 04:05 PM   Modules accepted: Orders

## 2023-08-08 ENCOUNTER — Encounter (HOSPITAL_COMMUNITY): Payer: Self-pay

## 2023-08-11 ENCOUNTER — Ambulatory Visit (HOSPITAL_COMMUNITY): Payer: Medicare Other

## 2023-08-12 ENCOUNTER — Ambulatory Visit (HOSPITAL_COMMUNITY)
Admission: RE | Admit: 2023-08-12 | Discharge: 2023-08-12 | Disposition: A | Discharge status: Home / Self Care | Payer: Medicare Other | Source: Ambulatory Visit | Attending: Cardiology | Admitting: Cardiology

## 2023-08-12 DIAGNOSIS — Z131 Encounter for screening for diabetes mellitus: Secondary | ICD-10-CM | POA: Insufficient documentation

## 2023-08-12 DIAGNOSIS — R0789 Other chest pain: Secondary | ICD-10-CM | POA: Insufficient documentation

## 2023-08-12 DIAGNOSIS — R072 Precordial pain: Secondary | ICD-10-CM | POA: Insufficient documentation

## 2023-08-12 DIAGNOSIS — R1013 Epigastric pain: Secondary | ICD-10-CM | POA: Diagnosis present

## 2023-08-12 MED ORDER — NITROGLYCERIN 0.4 MG SL SUBL
0.8000 mg | SUBLINGUAL_TABLET | Freq: Once | SUBLINGUAL | Status: AC
  Administered 2023-08-12: 0.8 mg via SUBLINGUAL

## 2023-08-12 MED ORDER — NITROGLYCERIN 0.4 MG SL SUBL
SUBLINGUAL_TABLET | SUBLINGUAL | Status: AC
  Filled 2023-08-12: qty 2

## 2023-08-12 MED ORDER — IOHEXOL 350 MG/ML SOLN
100.0000 mL | Freq: Once | INTRAVENOUS | Status: AC | PRN
  Administered 2023-08-12: 100 mL via INTRAVENOUS

## 2023-08-18 ENCOUNTER — Ambulatory Visit: Payer: Medicare Other | Admitting: Cardiovascular Disease

## 2023-08-19 ENCOUNTER — Ambulatory Visit: Payer: Medicare Other | Admitting: Vascular Surgery

## 2023-08-21 ENCOUNTER — Other Ambulatory Visit (HOSPITAL_COMMUNITY): Payer: Medicare Other

## 2023-08-26 ENCOUNTER — Ambulatory Visit: Payer: Medicare Other | Admitting: Physical Therapy

## 2023-09-05 ENCOUNTER — Other Ambulatory Visit (HOSPITAL_COMMUNITY): Payer: Medicare Other

## 2023-09-08 ENCOUNTER — Ambulatory Visit (HOSPITAL_COMMUNITY): Payer: Medicare Other | Attending: Cardiovascular Disease

## 2023-09-08 DIAGNOSIS — R0789 Other chest pain: Secondary | ICD-10-CM | POA: Insufficient documentation

## 2023-09-08 DIAGNOSIS — R072 Precordial pain: Secondary | ICD-10-CM | POA: Diagnosis present

## 2023-09-08 DIAGNOSIS — Z131 Encounter for screening for diabetes mellitus: Secondary | ICD-10-CM | POA: Diagnosis present

## 2023-09-08 DIAGNOSIS — R1013 Epigastric pain: Secondary | ICD-10-CM | POA: Insufficient documentation

## 2023-09-08 LAB — ECHOCARDIOGRAM COMPLETE
Area-P 1/2: 2.06 cm2
S' Lateral: 3.5 cm

## 2023-09-15 ENCOUNTER — Ambulatory Visit: Payer: Medicare Other | Admitting: Physical Therapy

## 2023-09-17 ENCOUNTER — Ambulatory Visit: Payer: Medicare Other | Admitting: Orthopaedic Surgery

## 2023-09-27 ENCOUNTER — Other Ambulatory Visit: Payer: Self-pay | Admitting: Physician Assistant

## 2023-09-30 ENCOUNTER — Ambulatory Visit: Payer: Medicare Other | Admitting: Vascular Surgery

## 2023-09-30 ENCOUNTER — Encounter: Payer: Self-pay | Admitting: Vascular Surgery

## 2023-09-30 VITALS — BP 121/79 | HR 76 | Temp 97.6°F | Ht 71.0 in | Wt 315.6 lb

## 2023-09-30 DIAGNOSIS — I7102 Dissection of abdominal aorta: Secondary | ICD-10-CM | POA: Diagnosis not present

## 2023-09-30 DIAGNOSIS — I723 Aneurysm of iliac artery: Secondary | ICD-10-CM | POA: Diagnosis not present

## 2023-09-30 NOTE — Progress Notes (Signed)
Patient name: Steve [name] MRN: [id number] DOB: 06-Dec-1945 Sex: male  REASON FOR VISIT: 1 year follow-up for infra-renal aortic dissection/iliac aneurysm  HPI: Steve [name] is a 77 y.o. male with hx HTN that presents for 1 year follow-up of an aortic dissection previously followed by Dr. Arbie Cookey.  Reportedly had an incidental finding of an infrarenal aortic dissection when he saw Dr. Arbie Cookey on 06/01/2019.  At the time of evaluation apparently had diverticulitis with abdominal pain and there was also an incidental finding of an infrarenal aortic dissection.  This has been followed with serial CT scans.    No specific concerns today.  He is having no lower extremity claudication or other symptoms in his legs when he ambulates.  Some intermittent hip pain.  Past Medical History:  Diagnosis Date   Dementia (HCC)    Diverticulitis    Hypercholesteremia    Hypertension    Osteoporosis    Thyroid disease     Past Surgical History:  Procedure Laterality Date   NOSE SURGERY     TRANSURETHRAL RESECTION OF PROSTATE      Family History  Problem Relation Age of Onset   Alzheimer's disease Mother    Cancer Father     SOCIAL HISTORY: Social History   Tobacco Use   Smoking status: Never   Smokeless tobacco: Never  Substance Use Topics   Alcohol use: No    Allergies  Allergen Reactions   Donepezil Diarrhea and Other (See Comments)    Abdominal pain Diarrhea Abdominal pain Diarrhea    Benzonatate Swelling    Tessalon pearls   Penicillin G Rash   Penicillins Rash    Current Outpatient Medications  Medication Sig Dispense Refill   ALPRAZolam (XANAX) 0.25 MG tablet Take 0.25 mg by mouth 2 (two) times daily as needed.     Ascorbic Acid (VITAMIN C) 1000 MG tablet Take 500 mg by mouth daily.     aspirin EC 81 MG tablet Take 325 mg by mouth daily.     B Complex Vitamins (VITAMIN B-COMPLEX) TABS Take 1 tablet by mouth daily.     cholecalciferol (VITAMIN D) 1000 UNITS tablet Take  1,000 Units by mouth daily.     clorazepate (TRANXENE) 7.5 MG tablet      cyanocobalamin 100 MCG tablet Take 100 mcg by mouth daily.     escitalopram (LEXAPRO) 10 MG tablet Take 10 mg by mouth daily.     fish oil-omega-3 fatty acids 1000 MG capsule Take 1-2 g by mouth 2 (two) times daily. Take 1 capsule in the morning and 2 capsules in the evening     fluticasone (FLONASE) 50 MCG/ACT nasal spray Place 2 sprays into both nostrils daily.     levothyroxine (SYNTHROID) 125 MCG tablet      lisinopril (PRINIVIL,ZESTRIL) 10 MG tablet Take 10 mg by mouth daily.     memantine (NAMENDA) 10 MG tablet      metoprolol tartrate (LOPRESSOR) 50 MG tablet Take 50 mg 2 hrs before Coronary CT 1 tablet 0   montelukast (SINGULAIR) 10 MG tablet Take 10 mg by mouth at bedtime.     MYRBETRIQ 50 MG TB24 tablet      pantoprazole (PROTONIX) 40 MG tablet      simvastatin (ZOCOR) 40 MG tablet Take 40 mg by mouth every evening.     sucralfate (CARAFATE) 1 g tablet Take 1 g by mouth 2 (two) times daily as needed.     tiZANidine (ZANAFLEX) 2 MG tablet TAKE  1 TABLET(2 MG) BY MOUTH AT BEDTIME 30 tablet 1   tobramycin-dexamethasone (TOBRADEX) ophthalmic solution Place 1 drop into both eyes every 4 (four) hours while awake. For 5 days 5 mL 0   traMADol (ULTRAM) 50 MG tablet Take 1 tablet (50 mg total) by mouth every 6 (six) hours as needed. 30 tablet 0   zoledronic acid (RECLAST) 5 MG/100ML SOLN injection Inject 5 mg into the vein once.     No current facility-administered medications for this visit.    REVIEW OF SYSTEMS:  [X]  denotes positive finding, [ ]  denotes negative finding Cardiac  Comments:  Chest pain or chest pressure:    Shortness of breath upon exertion:    Short of breath when lying flat:    Irregular heart rhythm:        Vascular    Pain in calf, thigh, or hip brought on by ambulation:    Pain in feet at night that wakes you up from your sleep:     Blood clot in your veins:    Leg swelling:          Pulmonary    Oxygen at home:    Productive cough:     Wheezing:         Neurologic    Sudden weakness in arms or legs:     Sudden numbness in arms or legs:     Sudden onset of difficulty speaking or slurred speech:    Temporary loss of vision in one eye:     Problems with dizziness:         Gastrointestinal    Blood in stool:     Vomited blood:         Genitourinary    Burning when urinating:     Blood in urine:        Psychiatric    Major depression:         Hematologic    Bleeding problems:    Problems with blood clotting too easily:        Skin    Rashes or ulcers:        Constitutional    Fever or chills:      PHYSICAL EXAM: There were no vitals filed for this visit.   GENERAL: The patient is a well-nourished male, in no acute distress. The vital signs are documented above. CARDIAC: There is a regular rate and rhythm.  VASCULAR: Palpable femoral pulses bilaterally Palpable dorsalis pedis pulses bilaterally PULMONARY: No respiratory distress ABDOMEN: Soft and non-tender.  No significant pain. MUSCULOSKELETAL: There are no major deformities or cyanosis. NEUROLOGIC: No focal weakness or paresthesias are detected.   DATA:   CTA 08/05/23 reviewed with stable infrarenal aortic dissection extending into both iliac arteries.  Infrarenal aneurysm measures 3.3 cm.  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  Stable ascending aortic aneurysm 4.5 cm.  CTA 07/29/2022 on my reviewe shows stable infrarenal aortic dissection that extends into both iliac arteries.  Infrarenal aneurysm is stable at 3.1 cm.  Right common iliac artery measures 3.1 cm and stable as well.  He does have a stable ascending aortic aneurysm at 4.5 cm.  Assessment/Plan:  77 year old male presents for 1 year follow-up for a incidental dissection of the infrarenal abdominal aorta initially evaluated by Dr. Arbie Cookey.  Repeat CTA 08/05/23 showed no significant interval change.  He has had a slight  increase in his abdominal aortic aneurysm to 3.3 cm (previously 3.1 cm).  His right common iliac  aneurysm is increased from 3.1 to 3.2 cm by my measurement.  This has all been relatively stable over multiple years.  Again discussed I would not pursue any surgical intervention at this time unless his right common iliac aneurysm was larger than 3.5 cm.  I will see him again in 1 year with repeat CTA.  Also discussed stable ascending aortic aneurysm at 4.5 cm. We will continue to follow.   Cephus Shelling, MD [facility] Office: [phone]

## 2023-10-06 ENCOUNTER — Other Ambulatory Visit: Payer: Self-pay

## 2023-10-06 ENCOUNTER — Ambulatory Visit: Payer: Medicare Other | Admitting: Physical Therapy

## 2023-10-06 ENCOUNTER — Encounter: Payer: Self-pay | Admitting: Physical Therapy

## 2023-10-06 DIAGNOSIS — M5459 Other low back pain: Secondary | ICD-10-CM

## 2023-10-06 DIAGNOSIS — R29898 Other symptoms and signs involving the musculoskeletal system: Secondary | ICD-10-CM | POA: Diagnosis not present

## 2023-10-06 NOTE — Therapy (Signed)
OUTPATIENT PHYSICAL THERAPY THORACOLUMBAR EVALUATION   Patient Name: Steve [name] MRN: [id number] DOB:[birth year], 77 y.o., male Today's Date: 10/06/2023  END OF SESSION:  PT End of Session - 10/06/23 1438     Visit Number 1    Number of Visits 7    Date for PT Re-Evaluation 10/27/23    Authorization Type [employer] MCR    Authorization Time Period 10/06/23 to 10/27/23    Progress Note Due on Visit 10    PT Start Time 1358    PT Stop Time 1427    PT Time Calculation (min) 29 min    Activity Tolerance Patient tolerated treatment well    Behavior During Therapy [facility] for tasks assessed/performed             Past Medical History:  Diagnosis Date   Dementia (HCC)    Diverticulitis    Hypercholesteremia    Hypertension    Osteoporosis    Thyroid disease    Past Surgical History:  Procedure Laterality Date   NOSE SURGERY     TRANSURETHRAL RESECTION OF PROSTATE     Patient Active Problem List   Diagnosis Date Noted   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   Regular astigmatism of left eye 09/04/2021   Posterior vitreous detachment of right eye 07/16/2021   Macular hole of left eye 01/04/2021   Vitreomacular traction syndrome of both eyes 01/04/2021   Aortic dissection (HCC) 08/15/2020   Bilateral hearing loss 02/28/2020   Vertigo 02/28/2020   Gait difficulty 02/16/2018   Idiopathic peripheral neuropathy 02/16/2018   Memory loss 02/16/2018   Mixed dementia (HCC) 02/16/2018   Senile nuclear sclerosis 03/21/2017   Risk for falls 06/26/2016   Rhinitis, chronic 05/21/2016   Sensorineural hearing loss (SNHL) of both ears 05/21/2016   Screening for malignant neoplasm of prostate 03/22/2016   Encounter for general adult medical examination without abnormal findings 03/22/2016   Morbid obesity (HCC) 03/22/2016   Mild cognitive impairment 01/22/2016   Benign non-nodular prostatic 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 lower urinary tract symptoms 01/17/2016   Irritability and anger 10/17/2015    Osteoporosis 10/17/2015   Acquired hypothyroidism 10/16/2015   Diverticulosis of large intestine 10/16/2015   Essential hypertension 10/16/2015   GERD without esophagitis 10/16/2015   Hyperlipidemia LDL goal <100 10/16/2015   Obstructive sleep apnea (adult) (pediatric) 10/16/2015   Frequency 07/29/2014    PCP: Creola Corn MD   REFERRING PROVIDER: Kirtland Bouchard, PA-C  REFERRING DIAG:  Diagnosis  M54.50 (ICD-10-CM) - Acute right-sided low back pain without sciatica    Rationale for Evaluation and Treatment: Rehabilitation  THERAPY DIAG:  Other low back pain  Other symptoms and signs involving the musculoskeletal system  ONSET DATE: "awhile back, maybe a couple months"   SUBJECTIVE:  SUBJECTIVE STATEMENT:  They say its my back but it's really my hip. Can't remember exactly when it started but maybe a couple months ago. Larey Seat off the lawnmower 3 weeks ago, was real sore but its gone now. Hip was bothering me real bad this morning, can be stiff in the mornings. I'm a couch potato, I need to get moving but I haven't yet. I'm not interested in coming to PT period, but I'll leave it up to the captain there (referring to spouse).   PERTINENT HISTORY:   Lumbar spine 2 views: Loss of lordotic curvature.  No spondylolisthesis.   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  Scoliosis present.  No acute  fractures.  PAIN:  Are you having pain? Yes: NPRS scale: 4 now, at worst 10 when getting up in the morning/10 Pain location: R hip  Pain description: stiff "a little hurting when I get up"  Aggravating factors: getting up in the mornings, walking more than 5 minutes  Relieving factors: rest, medicine   PRECAUTIONS: Other: hx of aortic an monitored by medical team   RED FLAGS: None   WEIGHT  BEARING RESTRICTIONS: No  FALLS:  Has patient fallen in last 6 months? Yes. Number of falls 1 off of lawnmower, no FOF   LIVING ENVIRONMENT: Lives with: lives with their family Lives in: House/apartment Stairs: back no steps, front 6-8 STE U rail  Has following equipment at home: Single point cane and Environmental consultant - 2 wheeled  OCCUPATION: retired- used Psychologist, [employer]  PLOF: Independent, Independent with basic ADLs, Independent with gait, and Independent with transfers  PATIENT GOALS: "get PT done and over with and get back on the road", be pain free   NEXT MD VISIT: Referring PRN   OBJECTIVE:  Note: Objective measures were completed at Evaluation unless otherwise noted.    PATIENT SURVEYS:  FOTO 68, predicted 70 in 10 visits    COGNITION: Overall cognitive status: Within functional limits for tasks assessed     SENSATION: Not tested  MUSCLE LENGTH:  Hamstrings and piriformis severely limited  Hip flexors severe limitation per functional observation    LUMBAR ROM:   AROM eval  Flexion Moderate limitation   Extension Severe limitation   Right lateral flexion Severe limitation   Left lateral flexion Severe limitation   Right rotation Severe limitation  Left rotation Severe limitation    (Blank rows = not tested)    LOWER EXTREMITY MMT:    MMT Right eval Left eval  Hip flexion 4+ 4+  Hip extension At least 3 At least 3  Hip abduction 4+ 4+  Hip adduction    Hip internal rotation    Hip external rotation    Knee flexion 5 5  Knee extension 5 5  Ankle dorsiflexion 5 5  Ankle plantarflexion    Ankle inversion    Ankle eversion     (Blank rows = not tested)    TODAY'S TREATMENT:  DATE:   10/06/23  Eval, care planning, education on PT findings and anatomy of area/biomechanical causes for pain. Importance of compliance with  PT- he agreeable to trying 3 weeks of therapy, if improvement may agree to continue, if no improvement will DC at that point  [facility] 3x3 seconds B Lumbar rotation stretch 3x3 seconds B TA sets seated x10 Piriformis stretch seated x30 seconds B       PATIENT EDUCATION:  Education details: exam findings, POC Person educated: Patient Education method: Explanation Education comprehension: needs further education  HOME EXERCISE PROGRAM: Access Code: [email or web address] URL: [email or web address] Date: 10/06/2023 Prepared by: Nedra Hai  Exercises - Hooklying Single Knee to Chest Stretch  - 1 x daily - 7 x weekly - 2 sets - 5 reps - 5 seconds  hold - Supine Lower Trunk Rotation  - 1 x daily - 7 x weekly - 2 sets - 5 reps - 5 seconds hold - Supine Transversus Abdominis Bracing - Hands on Stomach  - 3 x daily - 7 x weekly - 1 sets - 10 reps - 5 seconds  hold - Seated Piriformis Stretch  - 1 x daily - 7 x weekly - 2 sets - 2 reps - 30 seconds  hold  ASSESSMENT:  CLINICAL IMPRESSION: Patient is a 77 y.o. M who was seen today for physical therapy evaluation and treatment for  Diagnosis  M54.50 (ICD-10-CM) - Acute right-sided low back pain without sciatica  . Exam with objective findings as above, of note pt did arrive late and then spent time completing FOTO survey so formal PT evaluation quite limited today. Will make every effort to improve objective findings and subjective concerns moving forward.   OBJECTIVE IMPAIRMENTS: Abnormal gait, decreased activity tolerance, decreased mobility, difficulty walking, decreased ROM, decreased strength, impaired flexibility, postural dysfunction, obesity, and pain.   ACTIVITY LIMITATIONS: carrying, lifting, standing, sleeping, transfers, bed mobility, and locomotion level  PARTICIPATION LIMITATIONS: driving, shopping, community activity, and yard work  PERSONAL FACTORS: Age, Behavior pattern, Education, Fitness, and Time since onset of  injury/illness/exacerbation are also affecting patient's functional outcome.   REHAB POTENTIAL: Poor sedentary lifestyle, poor buy in to PT/very low interest in completing physical therapy   CLINICAL DECISION MAKING: Stable/uncomplicated  EVALUATION COMPLEXITY: Low   GOALS: Goals reviewed with patient? No  SHORT TERM GOALS: Target date: 10/27/2023    Will be compliant with appropriate progressive HEP  Baseline: Goal status: INITIAL  2.  Pain to be no more than 6/10 at worst  Baseline:  Goal status: INITIAL  3.  Lumbar ROM to be no more than 50% limited all planes of motion Baseline:  Goal status: INITIAL  4.  LE muscle flexibility to have improved by at least 50%  Baseline:  Goal status: INITIAL    LONG TERM GOALS: Target date: LTGs = STGs    PLAN:  PT FREQUENCY: 1-2x/week  PT DURATION: 3 weeks  PLANNED INTERVENTIONS: 97164- PT Re-evaluation, 97110-Therapeutic exercises, 97530- Therapeutic activity, [id number]- Neuromuscular re-education, 97535- Self Care, 54098- Manual therapy, [id number]- Aquatic Therapy, 97014- Electrical stimulation (unattended), [phone]- Ionotophoresis 4mg /ml Dexamethasone, Taping, Dry Needling, Joint mobilization, Cryotherapy, and Moist heat.  PLAN FOR NEXT SESSION: lumbar and hip/LE flexibility, core strength. If he is compliant with HEP/PT and makes reasonable gains, will extend POC as appropriate   Nedra Hai, PT, DPT 10/06/23 2:43 PM  Date of referral: 08/07/23 Referring provider: Richardean Canal PA-C  Referring diagnosis?  Diagnosis  M54.50 (ICD-10-CM) - Acute right-sided low back pain without sciatica  Treatment diagnosis? (if different than referring diagnosis)   M54.59  R29.898  What was this (referring dx) caused by? Ongoing Issue  Ashby Dawes of Condition: Initial Onset (within last 3 months)   Laterality: Rt  Current Functional Measure Score: FOTO 68  Objective measurements identify impairments when they are compared to normal  values, the uninvolved extremity, and prior level of function.  [x]  Yes  []  No  Objective assessment of functional ability: Minimal functional limitations   Briefly describe symptoms: stiffness in R hip more than lumbar pain  How did symptoms start: insidiously   Average pain intensity:  Last 24 hours: 5/10  Past week: 5/10  How often does the pt experience symptoms? Constantly  How much have the symptoms interfered with usual daily activities? A little bit  How has condition changed since care began at this facility? No change  In general, how is the patients overall health? Fair   BACK PAIN (STarT Back Screening Tool) Has pain spread down the leg(s) at some time in the last 2 weeks? No  Has there been pain in the shoulder or neck at some time in the last 2 weeks? No  Has the pt only walked short distances because of back pain? Yes  Has patient dressed more slowly because of back pain in the past 2 weeks? No  Does patient think it's not safe for a person with this condition to be physically active? No  Does patient have worrying thoughts a lot of the time? No  Does patient feel back pain is terrible and will never get any better? No  Has patient stopped enjoying things they usually enjoy? No

## 2023-10-21 ENCOUNTER — Encounter: Payer: Self-pay | Admitting: Physical Therapy

## 2023-10-21 ENCOUNTER — Ambulatory Visit: Payer: Medicare Other | Admitting: Physical Therapy

## 2023-10-21 DIAGNOSIS — M5459 Other low back pain: Secondary | ICD-10-CM | POA: Diagnosis not present

## 2023-10-21 DIAGNOSIS — R29898 Other symptoms and signs involving the musculoskeletal system: Secondary | ICD-10-CM | POA: Diagnosis not present

## 2023-10-21 DIAGNOSIS — R262 Difficulty in walking, not elsewhere classified: Secondary | ICD-10-CM | POA: Diagnosis not present

## 2023-10-21 NOTE — Therapy (Signed)
 OUTPATIENT PHYSICAL THERAPY THORACOLUMBAR TREATMENT   Patient Name: Steve [name] MRN: [id number] DOB:02/24/1946, 77 y.o., male Today's Date: 10/21/2023  END OF SESSION:  PT End of Session - 10/21/23 1352     Visit Number 2    Number of Visits 7    Date for PT Re-Evaluation 10/27/23    Authorization Type U[employer] MCR    Authorization Time Period 10/06/23 to 10/27/23    Progress Note Due on Visit 10    PT Start Time 1348    PT Stop Time 1427    PT Time Calculation (min) 39 min    Activity Tolerance Patient tolerated treatment well    Behavior During Therapy [facility] for tasks assessed/performed              Past Medical History:  Diagnosis Date   Dementia (HCC)    Diverticulitis    Hypercholesteremia    Hypertension    Osteoporosis    Thyroid  disease    Past Surgical History:  Procedure Laterality Date   NOSE SURGERY     TRANSURETHRAL RESECTION OF PROSTATE     Patient Active Problem List   Diagnosis Date Noted   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   Regular astigmatism of left eye 09/04/2021   Posterior vitreous detachment of right eye 07/16/2021   Macular hole of left eye 01/04/2021   Vitreomacular traction syndrome of both eyes 01/04/2021   Aortic dissection (HCC) 08/15/2020   Bilateral hearing loss 02/28/2020   Vertigo 02/28/2020   Gait difficulty 02/16/2018   Idiopathic peripheral neuropathy 02/16/2018   Memory loss 02/16/2018   Mixed dementia (HCC) 02/16/2018   Senile nuclear sclerosis 03/21/2017   Risk for falls 06/26/2016   Rhinitis, chronic 05/21/2016   Sensorineural hearing loss (SNHL) of both ears 05/21/2016   Screening for malignant neoplasm of prostate 03/22/2016   Encounter for general adult medical examination without abnormal findings 03/22/2016   Morbid obesity (HCC) 03/22/2016   Mild cognitive impairment 01/22/2016   Benign non-nodular prostatic 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 lower urinary tract symptoms 01/17/2016   Irritability and anger 10/17/2015    Osteoporosis 10/17/2015   Acquired hypothyroidism 10/16/2015   Diverticulosis of large intestine 10/16/2015   Essential hypertension 10/16/2015   GERD without esophagitis 10/16/2015   Hyperlipidemia LDL goal <100 10/16/2015   Obstructive sleep apnea (adult) (pediatric) 10/16/2015   Frequency 07/29/2014    PCP: Onita Rush MD   REFERRING PROVIDER: Gretta Bertrum ORN, PA-C  REFERRING DIAG:  Diagnosis  M54.50 (ICD-10-CM) - Acute right-sided low back pain without sciatica    Rationale for Evaluation and Treatment: Rehabilitation  THERAPY DIAG:  Other low back pain  Other symptoms and signs involving the musculoskeletal system  Difficulty in walking, not elsewhere classified  ONSET DATE: awhile back, maybe a couple months   SUBJECTIVE:  SUBJECTIVE STATEMENT:  Had stomach trouble since last time, other than that I'm OK. Didn't do HEP when I was sick, didn't really do the HEP or exercise otherwise because I was lazy to be honest. Hip has been feeling better, nowhere as bad as it used to be.    EVAL: They say its my back but it's really my hip. Can't remember exactly when it started but maybe a couple months ago. [place]ens off the lawnmower 3 weeks ago, was real sore but its gone now. Hip was bothering me real bad this morning, can be stiff in the mornings. I'm a couch potato, I need to get moving but I haven't yet. I'm not interested in coming to PT period, but I'll leave it up to the captain there (referring to spouse).   PERTINENT HISTORY:   Lumbar spine 2 views: Loss of lordotic curvature.  No spondylolisthesis.   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  Scoliosis present.  No acute  fractures.  PAIN:  Are you having pain? No 0/10  PRECAUTIONS: Other: hx of aortic an monitored by medical  team   RED FLAGS: None   WEIGHT BEARING RESTRICTIONS: No  FALLS:  Has patient fallen in last 6 months? Yes. Number of falls 1 off of lawnmower, no FOF   LIVING ENVIRONMENT: Lives with: lives with their family Lives in: House/apartment Stairs: back no steps, front 6-8 STE U rail  Has following equipment at home: Single point cane and Walker - 2 wheeled  OCCUPATION: retired- used psychologist, [employer]  PLOF: Independent, Independent with basic ADLs, Independent with gait, and Independent with transfers  PATIENT GOALS: get PT done and over with and get back on the road, be pain free   NEXT MD VISIT: Referring PRN   OBJECTIVE:  Note: Objective measures were completed at Evaluation unless otherwise noted.    PATIENT SURVEYS:  FOTO 68, predicted 70 in 10 visits    COGNITION: Overall cognitive status: Within functional limits for tasks assessed     SENSATION: Not tested  MUSCLE LENGTH:  Hamstrings and piriformis severely limited  Hip flexors severe limitation per functional observation    LUMBAR ROM:   AROM eval  Flexion Moderate limitation   Extension Severe limitation   Right lateral flexion Severe limitation   Left lateral flexion Severe limitation   Right rotation Severe limitation  Left rotation Severe limitation    (Blank rows = not tested)    LOWER EXTREMITY MMT:    MMT Right eval Left eval  Hip flexion 4+ 4+  Hip extension At least 3 At least 3  Hip abduction 4+ 4+  Hip adduction    Hip internal rotation    Hip external rotation    Knee flexion 5 5  Knee extension 5 5  Ankle dorsiflexion 5 5  Ankle plantarflexion    Ankle inversion    Ankle eversion     (Blank rows = not tested)    TODAY'S TREATMENT:  DATE:   10/21/23  TherEx  Nustep L3 x8 minutes BLEs only for general conditioning, w/u, tissue  perfusion Lumbar rotation stretches 5x5 seconds B SKTC 5x5 second holds B  HS stretches 2x30 seconds B Piriformis figure 4 stretches 2x30 seconds B Hip flexor stretch 2x30 seconds B Bridges x12  TA sets supine 15x3 seconds  QL stretches 3x30 seconds       10/06/23  Eval, care planning, education on PT findings and anatomy of area/biomechanical causes for pain. Importance of compliance with PT- he agreeable to trying 3 weeks of therapy, if improvement may agree to continue, if no improvement will DC at that point  [facility] 3x3 seconds B Lumbar rotation stretch 3x3 seconds B TA sets seated x10 Piriformis stretch seated x30 seconds B       PATIENT EDUCATION:  Education details: exam findings, POC Person educated: Patient Education method: Explanation Education comprehension: needs further education  HOME EXERCISE PROGRAM: Access Code: [email or web address] URL: [email or web address] Date: 10/06/2023 Prepared by: Josette Rough  Exercises - Hooklying Single Knee to Chest Stretch  - 1 x daily - 7 x weekly - 2 sets - 5 reps - 5 seconds  hold - Supine Lower Trunk Rotation  - 1 x daily - 7 x weekly - 2 sets - 5 reps - 5 seconds hold - Supine Transversus Abdominis Bracing - Hands on Stomach  - 3 x daily - 7 x weekly - 1 sets - 10 reps - 5 seconds  hold - Seated Piriformis Stretch  - 1 x daily - 7 x weekly - 2 sets - 2 reps - 30 seconds  hold  ASSESSMENT:  CLINICAL IMPRESSION:  Pt arrives today doing OK, was not compliant with HEP or exercise recommendations since initial evaluation. Does report that his hip is better but I am confident that this is likely due to healing effects of time rather than PT interventions. Worked on general conditioning, lumbar/hip flexibility, and general strength as per POC today. Will continue efforts. Felt much better at EOS, encouraged better HEP compliance between now and next visit.      EVAL: Patient is a 77 y.o. M who was seen today for  physical therapy evaluation and treatment for  Diagnosis  M54.50 (ICD-10-CM) - Acute right-sided low back pain without sciatica  . Exam with objective findings as above, of note pt did arrive late and then spent time completing FOTO survey so formal PT evaluation quite limited today. Will make every effort to improve objective findings and subjective concerns moving forward.   OBJECTIVE IMPAIRMENTS: Abnormal gait, decreased activity tolerance, decreased mobility, difficulty walking, decreased ROM, decreased strength, impaired flexibility, postural dysfunction, obesity, and pain.   ACTIVITY LIMITATIONS: carrying, lifting, standing, sleeping, transfers, bed mobility, and locomotion level  PARTICIPATION LIMITATIONS: driving, shopping, community activity, and yard work  PERSONAL FACTORS: Age, Behavior pattern, Education, Fitness, and Time since onset of injury/illness/exacerbation are also affecting patient's functional outcome.   REHAB POTENTIAL: Poor sedentary lifestyle, poor buy in to PT/very low interest in completing physical therapy   CLINICAL DECISION MAKING: Stable/uncomplicated  EVALUATION COMPLEXITY: Low   GOALS: Goals reviewed with patient? No  SHORT TERM GOALS: Target date: 10/27/2023    Will be compliant with appropriate progressive HEP  Baseline: Goal status: INITIAL  2.  Pain to be no more than 6/10 at worst  Baseline:  Goal status: INITIAL  3.  Lumbar ROM to be no more than 50% limited all planes of motion Baseline:  Goal status: INITIAL  4.  LE muscle flexibility to have improved by at least 50%  Baseline:  Goal status: INITIAL    LONG TERM GOALS: Target date: LTGs = STGs    PLAN:  PT FREQUENCY: 1-2x/week  PT DURATION: 3 weeks  PLANNED INTERVENTIONS: 97164- PT Re-evaluation, 97110-Therapeutic exercises, 97530- Therapeutic activity, [id number]- Neuromuscular re-education, 97535- Self Care, 02859- Manual therapy, [id number]- Aquatic Therapy, 97014- Electrical  stimulation (unattended), [id number]- Ionotophoresis 4mg /ml Dexamethasone , Taping, Dry Needling, Joint mobilization, Cryotherapy, and Moist heat.  PLAN FOR NEXT SESSION: HOH. lumbar and hip/LE flexibility, core strength. If he is compliant with HEP/PT and makes reasonable gains, will extend POC as appropriate- otherwise will DC back to MD   Josette Rough, PT, DPT 10/21/23 2:28 PM  Date of referral: 08/07/23 Referring provider: Bertrum Gaskins PA-C  Referring diagnosis?  Diagnosis  M54.50 (ICD-10-CM) - Acute right-sided low back pain without sciatica   Treatment diagnosis? (if different than referring diagnosis)   M54.59  R29.898  What was this (referring dx) caused by? Ongoing Issue  [place] of Condition: Initial Onset (within last 3 months)   Laterality: Rt  Current Functional Measure Score: FOTO 68  Objective measurements identify impairments when they are compared to normal values, the uninvolved extremity, and prior level of function.  [x]  Yes  []  No  Objective assessment of functional ability: Minimal functional limitations   Briefly describe symptoms: stiffness in R hip more than lumbar pain  How did symptoms start: insidiously   Average pain intensity:  Last 24 hours: 5/10  Past week: 5/10  How often does the pt experience symptoms? Constantly  How much have the symptoms interfered with usual daily activities? A little bit  How has condition changed since care began at this facility? No change  In general, how is the patients overall health? Fair   BACK PAIN (STarT Back Screening Tool) Has pain spread down the leg(s) at some time in the last 2 weeks? No  Has there been pain in the shoulder or neck at some time in the last 2 weeks? No  Has the pt only walked short distances because of back pain? Yes  Has patient dressed more slowly because of back pain in the past 2 weeks? No  Does patient think it's not safe for a person with this condition to be physically  active? No  Does patient have worrying thoughts a lot of the time? No  Does patient feel back pain is terrible and will never get any better? No  Has patient stopped enjoying things they usually enjoy? No

## 2023-10-24 ENCOUNTER — Ambulatory Visit: Payer: PPO | Admitting: Physical Therapy

## 2023-10-24 ENCOUNTER — Encounter: Payer: Self-pay | Admitting: Physical Therapy

## 2023-10-24 DIAGNOSIS — M5459 Other low back pain: Secondary | ICD-10-CM | POA: Diagnosis not present

## 2023-10-24 DIAGNOSIS — R262 Difficulty in walking, not elsewhere classified: Secondary | ICD-10-CM | POA: Diagnosis not present

## 2023-10-24 DIAGNOSIS — R29898 Other symptoms and signs involving the musculoskeletal system: Secondary | ICD-10-CM

## 2023-10-24 NOTE — Therapy (Signed)
 OUTPATIENT PHYSICAL THERAPY THORACOLUMBAR TREATMENT   Patient Name: Steve [name] MRN: [id number] DOB:[birth year], 78 y.o., male Today's Date: 10/24/2023  END OF SESSION:  PT End of Session - 10/24/23 1349     Visit Number 3    Number of Visits 7    Date for PT Re-Evaluation 10/27/23    Authorization Type U[employer] MCR    Authorization Time Period 10/06/23 to 10/27/23    Progress Note Due on Visit 10    PT Start Time 1349    PT Stop Time 1427    PT Time Calculation (min) 38 min    Activity Tolerance Patient tolerated treatment well    Behavior During Therapy [facility] for tasks assessed/performed               Past Medical History:  Diagnosis Date   Dementia (HCC)    Diverticulitis    Hypercholesteremia    Hypertension    Osteoporosis    Thyroid  disease    Past Surgical History:  Procedure Laterality Date   NOSE SURGERY     TRANSURETHRAL RESECTION OF PROSTATE     Patient Active Problem List   Diagnosis Date Noted   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   Regular astigmatism of left eye 09/04/2021   Posterior vitreous detachment of right eye 07/16/2021   Macular hole of left eye 01/04/2021   Vitreomacular traction syndrome of both eyes 01/04/2021   Aortic dissection (HCC) 08/15/2020   Bilateral hearing loss 02/28/2020   Vertigo 02/28/2020   Gait difficulty 02/16/2018   Idiopathic peripheral neuropathy 02/16/2018   Memory loss 02/16/2018   Mixed dementia (HCC) 02/16/2018   Senile nuclear sclerosis 03/21/2017   Risk for falls 06/26/2016   Rhinitis, chronic 05/21/2016   Sensorineural hearing loss (SNHL) of both ears 05/21/2016   Screening for malignant neoplasm of prostate 03/22/2016   Encounter for general adult medical examination without abnormal findings 03/22/2016   Morbid obesity (HCC) 03/22/2016   Mild cognitive impairment 01/22/2016   Benign non-nodular prostatic 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 lower urinary tract symptoms 01/17/2016   Irritability and anger 10/17/2015    Osteoporosis 10/17/2015   Acquired hypothyroidism 10/16/2015   Diverticulosis of large intestine 10/16/2015   Essential hypertension 10/16/2015   GERD without esophagitis 10/16/2015   Hyperlipidemia LDL goal <100 10/16/2015   Obstructive sleep apnea (adult) (pediatric) 10/16/2015   Frequency 07/29/2014    PCP: Onita Rush MD   REFERRING PROVIDER: Gretta Bertrum ORN, PA-C  REFERRING DIAG:  Diagnosis  M54.50 (ICD-10-CM) - Acute right-sided low back pain without sciatica    Rationale for Evaluation and Treatment: Rehabilitation  THERAPY DIAG:  Other low back pain  Other symptoms and signs involving the musculoskeletal system  Difficulty in walking, not elsewhere classified  ONSET DATE: awhile back, maybe a couple months   SUBJECTIVE:  SUBJECTIVE STATEMENT:  I was tired and sore after last time I guess that means its working. Walked yesterday, didn't do anything else. Didn't do HEP.    EVAL: They say its my back but it's really my hip. Can't remember exactly when it started but maybe a couple months ago. Clemens off the lawnmower 3 weeks ago, was real sore but its gone now. Hip was bothering me real bad this morning, can be stiff in the mornings. I'm a couch potato, I need to get moving but I haven't yet. I'm not interested in coming to PT period, but I'll leave it up to the captain there (referring to spouse).   PERTINENT HISTORY:   Lumbar spine 2 views: Loss of lordotic curvature.  No spondylolisthesis.   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  Scoliosis present.  No acute  fractures.  PAIN:  Are you having pain? No 0/10, over the past week peptic ulcers have been the biggest problem (more than the hip/back)  PRECAUTIONS: Other: hx of aortic an monitored by medical team   RED  FLAGS: None   WEIGHT BEARING RESTRICTIONS: No  FALLS:  Has patient fallen in last 6 months? Yes. Number of falls 1 off of lawnmower, no FOF   LIVING ENVIRONMENT: Lives with: lives with their family Lives in: House/apartment Stairs: back no steps, front 6-8 STE U rail  Has following equipment at home: Single point cane and Walker - 2 wheeled  OCCUPATION: retired- used psychologist, [employer]  PLOF: Independent, Independent with basic ADLs, Independent with gait, and Independent with transfers  PATIENT GOALS: get PT done and over with and get back on the road, be pain free   NEXT MD VISIT: Referring PRN   OBJECTIVE:  Note: Objective measures were completed at Evaluation unless otherwise noted.    PATIENT SURVEYS:  FOTO 68, predicted 70 in 10 visits    COGNITION: Overall cognitive status: Within functional limits for tasks assessed     SENSATION: Not tested  MUSCLE LENGTH:  Hamstrings and piriformis severely limited  Hip flexors severe limitation per functional observation    LUMBAR ROM:   AROM eval  Flexion Moderate limitation   Extension Severe limitation   Right lateral flexion Severe limitation   Left lateral flexion Severe limitation   Right rotation Severe limitation  Left rotation Severe limitation    (Blank rows = not tested)    LOWER EXTREMITY MMT:    MMT Right eval Left eval  Hip flexion 4+ 4+  Hip extension At least 3 At least 3  Hip abduction 4+ 4+  Hip adduction    Hip internal rotation    Hip external rotation    Knee flexion 5 5  Knee extension 5 5  Ankle dorsiflexion 5 5  Ankle plantarflexion    Ankle inversion    Ankle eversion     (Blank rows = not tested)    TODAY'S TREATMENT:  DATE:   10/24/23  TherEx  Nustep L3 x10 minutes BLEs only for w/u, general conditioning, tissue perfusion Bridges x15   Sidelying clams green TB x12 B Bridges + ABD into green TB x12  TA set + dead bugs x10 B Piriformis stretch 2x30 seconds B  Hip flexor stretch 2x30 seconds B QL stretch 3 way x30 each Seated TA set + march x10 B     10/21/23  TherEx  Nustep L3 x8 minutes BLEs only for general conditioning, w/u, tissue perfusion Lumbar rotation stretches 5x5 seconds B SKTC 5x5 second holds B  HS stretches 2x30 seconds B Piriformis figure 4 stretches 2x30 seconds B Hip flexor stretch 2x30 seconds B Bridges x12  TA sets supine 15x3 seconds  QL stretches 3x30 seconds       10/06/23  Eval, care planning, education on PT findings and anatomy of area/biomechanical causes for pain. Importance of compliance with PT- he agreeable to trying 3 weeks of therapy, if improvement may agree to continue, if no improvement will DC at that point  [facility] 3x3 seconds B Lumbar rotation stretch 3x3 seconds B TA sets seated x10 Piriformis stretch seated x30 seconds B       PATIENT EDUCATION:  Education details: exam findings, POC Person educated: Patient Education method: Explanation Education comprehension: needs further education  HOME EXERCISE PROGRAM: Access Code: [email or web address] URL: [email or web address] Date: 10/06/2023 Prepared by: Josette Rough  Exercises - Hooklying Single Knee to Chest Stretch  - 1 x daily - 7 x weekly - 2 sets - 5 reps - 5 seconds  hold - Supine Lower Trunk Rotation  - 1 x daily - 7 x weekly - 2 sets - 5 reps - 5 seconds hold - Supine Transversus Abdominis Bracing - Hands on Stomach  - 3 x daily - 7 x weekly - 1 sets - 10 reps - 5 seconds  hold - Seated Piriformis Stretch  - 1 x daily - 7 x weekly - 2 sets - 2 reps - 30 seconds  hold  ASSESSMENT:  CLINICAL IMPRESSION:  Pt arrives today doing OK, had some soreness and fatigue as expected after last session. He will be due for re-cert next visit, will need to update objectives and assess his interest in  continuing with PT. There was a bit of a delay getting started with tx sessions, but still he has not been compliant with his HEP since initial eval, does have improvement in pain but this is likely from time rather than the effects of PT. Could potentially DC if he is not interested in continuing.      EVAL: Patient is a 78 y.o. M who was seen today for physical therapy evaluation and treatment for  Diagnosis  M54.50 (ICD-10-CM) - Acute right-sided low back pain without sciatica  . Exam with objective findings as above, of note pt did arrive late and then spent time completing FOTO survey so formal PT evaluation quite limited today. Will make every effort to improve objective findings and subjective concerns moving forward.   OBJECTIVE IMPAIRMENTS: Abnormal gait, decreased activity tolerance, decreased mobility, difficulty walking, decreased ROM, decreased strength, impaired flexibility, postural dysfunction, obesity, and pain.   ACTIVITY LIMITATIONS: carrying, lifting, standing, sleeping, transfers, bed mobility, and locomotion level  PARTICIPATION LIMITATIONS: driving, shopping, community activity, and yard work  PERSONAL FACTORS: Age, Behavior pattern, Education, Fitness, and Time since onset of injury/illness/exacerbation are also affecting patient's functional outcome.   REHAB POTENTIAL: Poor sedentary lifestyle, poor buy in to PT/very  low interest in completing physical therapy   CLINICAL DECISION MAKING: Stable/uncomplicated  EVALUATION COMPLEXITY: Low   GOALS: Goals reviewed with patient? No  SHORT TERM GOALS: Target date: 10/27/2023    Will be compliant with appropriate progressive HEP  Baseline: Goal status: INITIAL  2.  Pain to be no more than 6/10 at worst  Baseline:  Goal status: INITIAL  3.  Lumbar ROM to be no more than 50% limited all planes of motion Baseline:  Goal status: INITIAL  4.  LE muscle flexibility to have improved by at least 50%  Baseline:   Goal status: INITIAL    LONG TERM GOALS: Target date: LTGs = STGs    PLAN:  PT FREQUENCY: 1-2x/week  PT DURATION: 3 weeks  PLANNED INTERVENTIONS: 97164- PT Re-evaluation, 97110-Therapeutic exercises, 97530- Therapeutic activity, [id number]- Neuromuscular re-education, 97535- Self Care, 02859- Manual therapy, [id number]- Aquatic Therapy, 97014- Electrical stimulation (unattended), [phone]- Ionotophoresis 4mg /ml Dexamethasone , Taping, Dry Needling, Joint mobilization, Cryotherapy, and Moist heat.  PLAN FOR NEXT SESSION: Due for recert next visit- is there progress and is he interested in continuing?    Lumbar and hip/LE flexibility, core strength. If he is compliant with HEP/PT and makes reasonable gains, will extend POC as appropriate- otherwise will DC back to MD   Josette Rough, PT, DPT 10/24/23 2:27 PM  Date of referral: 08/07/23 Referring provider: Bertrum Gaskins PA-C  Referring diagnosis?  Diagnosis  M54.50 (ICD-10-CM) - Acute right-sided low back pain without sciatica   Treatment diagnosis? (if different than referring diagnosis)   M54.59  R29.898  What was this (referring dx) caused by? Ongoing Issue  Lysle of Condition: Initial Onset (within last 3 months)   Laterality: Rt  Current Functional Measure Score: FOTO 68  Objective measurements identify impairments when they are compared to normal values, the uninvolved extremity, and prior level of function.  [x]  Yes  []  No  Objective assessment of functional ability: Minimal functional limitations   Briefly describe symptoms: stiffness in R hip more than lumbar pain  How did symptoms start: insidiously   Average pain intensity:  Last 24 hours: 5/10  Past week: 5/10  How often does the pt experience symptoms? Constantly  How much have the symptoms interfered with usual daily activities? A little bit  How has condition changed since care began at this facility? No change  In general, how is the patients overall  health? Fair   BACK PAIN (STarT Back Screening Tool) Has pain spread down the leg(s) at some time in the last 2 weeks? No  Has there been pain in the shoulder or neck at some time in the last 2 weeks? No  Has the pt only walked short distances because of back pain? Yes  Has patient dressed more slowly because of back pain in the past 2 weeks? No  Does patient think it's not safe for a person with this condition to be physically active? No  Does patient have worrying thoughts a lot of the time? No  Does patient feel back pain is terrible and will never get any better? No  Has patient stopped enjoying things they usually enjoy? No

## 2023-10-28 ENCOUNTER — Ambulatory Visit: Payer: PPO | Admitting: Physical Therapy

## 2023-10-28 ENCOUNTER — Encounter: Payer: Self-pay | Admitting: Physical Therapy

## 2023-10-28 DIAGNOSIS — R29898 Other symptoms and signs involving the musculoskeletal system: Secondary | ICD-10-CM | POA: Diagnosis not present

## 2023-10-28 DIAGNOSIS — R262 Difficulty in walking, not elsewhere classified: Secondary | ICD-10-CM

## 2023-10-28 DIAGNOSIS — M5459 Other low back pain: Secondary | ICD-10-CM | POA: Diagnosis not present

## 2023-10-28 NOTE — Addendum Note (Signed)
 Addended by: April Manson on: 10/28/2023 02:41 PM   Modules accepted: Orders

## 2023-10-28 NOTE — Therapy (Addendum)
 OUTPATIENT PHYSICAL THERAPY THORACOLUMBAR TREATMENT/RECERT   Patient Name: Steve [name] MRN: [id number] DOB:[birth year], 78 y.o., male Today's Date: 10/28/2023  END OF SESSION:  PT End of Session - 10/28/23 1351     Visit Number 4    Number of Visits 8    Date for PT Re-Evaluation 11/25/23    Authorization Type UHC MCR    Authorization Time Period 10/06/23 to 10/27/23    Progress Note Due on Visit 10    PT Start Time 1345    PT Stop Time 1423    PT Time Calculation (min) 38 min    Activity Tolerance Patient tolerated treatment well    Behavior During Therapy [facility] for tasks assessed/performed               Past Medical History:  Diagnosis Date   Dementia (HCC)    Diverticulitis    Hypercholesteremia    Hypertension    Osteoporosis    Thyroid  disease    Past Surgical History:  Procedure Laterality Date   NOSE SURGERY     TRANSURETHRAL RESECTION OF PROSTATE     Patient Active Problem List   Diagnosis Date Noted   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   Regular astigmatism of left eye 09/04/2021   Posterior vitreous detachment of right eye 07/16/2021   Macular hole of left eye 01/04/2021   Vitreomacular traction syndrome of both eyes 01/04/2021   Aortic dissection (HCC) 08/15/2020   Bilateral hearing loss 02/28/2020   Vertigo 02/28/2020   Gait difficulty 02/16/2018   Idiopathic peripheral neuropathy 02/16/2018   Memory loss 02/16/2018   Mixed dementia (HCC) 02/16/2018   Senile nuclear sclerosis 03/21/2017   Risk for falls 06/26/2016   Rhinitis, chronic 05/21/2016   Sensorineural hearing loss (SNHL) of both ears 05/21/2016   Screening for malignant neoplasm of prostate 03/22/2016   Encounter for general adult medical examination without abnormal findings 03/22/2016   Morbid obesity (HCC) 03/22/2016   Mild cognitive impairment 01/22/2016   Benign non-nodular prostatic 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 lower urinary tract symptoms 01/17/2016   Irritability and anger  10/17/2015   Osteoporosis 10/17/2015   Acquired hypothyroidism 10/16/2015   Diverticulosis of large intestine 10/16/2015   Essential hypertension 10/16/2015   GERD without esophagitis 10/16/2015   Hyperlipidemia LDL goal <100 10/16/2015   Obstructive sleep apnea (adult) (pediatric) 10/16/2015   Frequency 07/29/2014    PCP: Onita Rush MD   REFERRING PROVIDER: Gretta Bertrum ORN, PA-C  REFERRING DIAG:  Diagnosis  M54.50 (ICD-10-CM) - Acute right-sided low back pain without sciatica    Rationale for Evaluation and Treatment: Rehabilitation  THERAPY DIAG:  Other low back pain  Other symptoms and signs involving the musculoskeletal system  Difficulty in walking, not elsewhere classified  ONSET DATE: awhile back, maybe a couple months   SUBJECTIVE:  SUBJECTIVE STATEMENT: Feeling 1/10 pain in his Right hip overall, wants to think if over about if he is ready to transition to independent program.   EVAL: They say its my back but it's really my hip. Can't remember exactly when it started but maybe a couple months ago. Clemens off the lawnmower 3 weeks ago, was real sore but its gone now. Hip was bothering me real bad this morning, can be stiff in the mornings. I'm a couch potato, I need to get moving but I haven't yet. I'm not interested in coming to PT period, but I'll leave it up to the captain there (referring to spouse).   PERTINENT HISTORY:   Lumbar spine 2 views: Loss of lordotic curvature.  No spondylolisthesis.   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  Scoliosis present.  No acute  fractures.  PAIN:  Are you having pain? No 0/10, over the past week peptic ulcers have been the biggest problem (more than the hip/back)  PRECAUTIONS: Other: hx of aortic an monitored by medical team   RED  FLAGS: None   WEIGHT BEARING RESTRICTIONS: No  FALLS:  Has patient fallen in last 6 months? Yes. Number of falls 1 off of lawnmower, no FOF   LIVING ENVIRONMENT: Lives with: lives with their family Lives in: House/apartment Stairs: back no steps, front 6-8 STE U rail  Has following equipment at home: Single point cane and Walker - 2 wheeled  OCCUPATION: retired- used psychologist, [employer]  PLOF: Independent, Independent with basic ADLs, Independent with gait, and Independent with transfers  PATIENT GOALS: get PT done and over with and get back on the road, be pain free   NEXT MD VISIT: Referring PRN   OBJECTIVE:  Note: Objective measures were completed at Evaluation unless otherwise noted.    PATIENT SURVEYS:  FOTO 68, predicted 70 in 10 visits    COGNITION: Overall cognitive status: Within functional limits for tasks assessed     SENSATION: Not tested  MUSCLE LENGTH:  Hamstrings and piriformis severely limited  Hip flexors severe limitation per functional observation    LUMBAR ROM:   AROM eval 10/28/23  Flexion Moderate limitation  75%  Extension Severe limitation  50%  Right lateral flexion Severe limitation  50%  Left lateral flexion Severe limitation  75%  Right rotation Severe limitation 75%  Left rotation Severe limitation  75%   (Blank rows = not tested)    LOWER EXTREMITY MMT:    MMT Right eval Left eval  Hip flexion 4+ 4+  Hip extension At least 3 At least 3  Hip abduction 4+ 4+  Hip adduction    Hip internal rotation    Hip external rotation    Knee flexion 5 5  Knee extension 5 5  Ankle dorsiflexion 5 5  Ankle plantarflexion    Ankle inversion    Ankle eversion     (Blank rows = not tested)    TODAY'S TREATMENT:  DATE: 10/28/23  TherEx Low trunk rotations 5 sec X 10 bilat Supine clams blue 2X10 Bridges  x15 hold 5 sec TA set + dead bugs x10 B Piriformis stretch 2x30 seconds B  Hip flexor stretch 2x30 seconds B Seated TA set + march x10 B Nustep L3 x10 minutes BLEs only for w/u, general conditioning, tissue perfusion  10/24/23  TherEx  Nustep L3 x10 minutes BLEs only for w/u, general conditioning, tissue perfusion Bridges x15  Sidelying clams green TB x12 B Bridges + ABD into green TB x12  TA set + dead bugs x10 B Piriformis stretch 2x30 seconds B  Hip flexor stretch 2x30 seconds B QL stretch 3 way x30 each Seated TA set + march x10 B     PATIENT EDUCATION:  Education details: exam findings, POC Person educated: Patient Education method: Explanation Education comprehension: needs further education  HOME EXERCISE PROGRAM: Access Code: [email or web address] URL: [email or web address] Date: 10/06/2023 Prepared by: Josette Rough  Exercises - Hooklying Single Knee to Chest Stretch  - 1 x daily - 7 x weekly - 2 sets - 5 reps - 5 seconds  hold - Supine Lower Trunk Rotation  - 1 x daily - 7 x weekly - 2 sets - 5 reps - 5 seconds hold - Supine Transversus Abdominis Bracing - Hands on Stomach  - 3 x daily - 7 x weekly - 1 sets - 10 reps - 5 seconds  hold - Seated Piriformis Stretch  - 1 x daily - 7 x weekly - 2 sets - 2 reps - 30 seconds  hold  ASSESSMENT:  CLINICAL IMPRESSION: Recert today as PT plan of care date had expired. He does show improved lumbar ROM and mobility but still with some weakness, pain, and deconditioning noted. He has not yet met PT goals so recert recommended for up to 4 more weeks of PT. When asked patient if he wants to continue with PT he wants to think it over for now.   EVAL: Patient is a 78 y.o. M who was seen today for physical therapy evaluation and treatment for  Diagnosis  M54.50 (ICD-10-CM) - Acute right-sided low back pain without sciatica  . Exam with objective findings as above, of note pt did arrive late and then spent time completing  FOTO survey so formal PT evaluation quite limited today. Will make every effort to improve objective findings and subjective concerns moving forward.   OBJECTIVE IMPAIRMENTS: Abnormal gait, decreased activity tolerance, decreased mobility, difficulty walking, decreased ROM, decreased strength, impaired flexibility, postural dysfunction, obesity, and pain.   ACTIVITY LIMITATIONS: carrying, lifting, standing, sleeping, transfers, bed mobility, and locomotion level  PARTICIPATION LIMITATIONS: driving, shopping, community activity, and yard work  PERSONAL FACTORS: Age, Behavior pattern, Education, Fitness, and Time since onset of injury/illness/exacerbation are also affecting patient's functional outcome.   REHAB POTENTIAL: Poor sedentary lifestyle, poor buy in to PT/very low interest in completing physical therapy   CLINICAL DECISION MAKING: Stable/uncomplicated  EVALUATION COMPLEXITY: Low   GOALS: Goals reviewed with patient? No  SHORT TERM GOALS: Target date: 11/25/2023    Will be compliant with appropriate progressive HEP  Baseline: Goal status: ongoing 10/28/23  2.  Pain to be no more than 6/10 at worst  Baseline:  Goal status: ongoing 10/28/23  3.  Lumbar ROM to be no more than 50% limited all planes of motion Baseline:  Goal status: ongoing 10/28/23  4.  LE muscle flexibility to have improved by at least 50%  Baseline:  Goal status: ongoing  10/28/23    LONG TERM GOALS: Target date: LTGs = STGs    PLAN:  PT FREQUENCY: 1-2x/week  PT DURATION: 3 weeks  PLANNED INTERVENTIONS: 97164- PT Re-evaluation, 97110-Therapeutic exercises, 97530- Therapeutic activity, [id number]- Neuromuscular re-education, 97535- Self Care, 02859- Manual therapy, [id number]- Aquatic Therapy, 97014- Electrical stimulation (unattended), [phone]- Ionotophoresis 4mg /ml Dexamethasone , Taping, Dry Needling, Joint mobilization, Cryotherapy, and Moist heat.  PLAN FOR NEXT SESSION: recert another 4 weeks  Lumbar and  hip/LE flexibility, core strength.   Redell Moose, PT, DPT 10/28/23 2:17 PM   Date of referral: 08/07/23 Referring provider: Bertrum Gaskins PA-C  Referring diagnosis?  Diagnosis  M54.50 (ICD-10-CM) - Acute right-sided low back pain without sciatica   Treatment diagnosis? (if different than referring diagnosis)   M54.59  R29.898  What was this (referring dx) caused by? Ongoing Issue  Lysle of Condition: Initial Onset (within last 3 months)   Laterality: Rt  Current Functional Measure Score: FOTO 68  Objective measurements identify impairments when they are compared to normal values, the uninvolved extremity, and prior level of function.  [x]  Yes  []  No  Objective assessment of functional ability: Minimal functional limitations   Briefly describe symptoms: stiffness in R hip more than lumbar pain  How did symptoms start: insidiously   Average pain intensity:  Last 24 hours: 5/10  Past week: 5/10  How often does the pt experience symptoms? Constantly  How much have the symptoms interfered with usual daily activities? A little bit  How has condition changed since care began at this facility? No change  In general, how is the patients overall health? Fair   BACK PAIN (STarT Back Screening Tool) Has pain spread down the leg(s) at some time in the last 2 weeks? No  Has there been pain in the shoulder or neck at some time in the last 2 weeks? No  Has the pt only walked short distances because of back pain? Yes  Has patient dressed more slowly because of back pain in the past 2 weeks? No  Does patient think it's not safe for a person with this condition to be physically active? No  Does patient have worrying thoughts a lot of the time? No  Does patient feel back pain is terrible and will never get any better? No  Has patient stopped enjoying things they usually enjoy? No

## 2023-10-30 ENCOUNTER — Encounter: Payer: Self-pay | Admitting: Physical Therapy

## 2023-10-30 ENCOUNTER — Ambulatory Visit: Payer: PPO | Admitting: Physical Therapy

## 2023-10-30 DIAGNOSIS — M5459 Other low back pain: Secondary | ICD-10-CM

## 2023-10-30 DIAGNOSIS — R29898 Other symptoms and signs involving the musculoskeletal system: Secondary | ICD-10-CM | POA: Diagnosis not present

## 2023-10-30 DIAGNOSIS — R262 Difficulty in walking, not elsewhere classified: Secondary | ICD-10-CM | POA: Diagnosis not present

## 2023-10-30 NOTE — Therapy (Addendum)
 OUTPATIENT PHYSICAL THERAPY THORACOLUMBAR TREATMENT/RECERT   Patient Name: Steve [name] MRN: [id number] DOB:1946-09-30, 78 y.o., male Today's Date: 10/30/2023  END OF SESSION:  PT End of Session - 10/30/23 1445     Visit Number 5    Number of Visits 8    Date for PT Re-Evaluation 11/25/23    Authorization Type [employer] MCR    Authorization Time Period 10/06/23 to 10/27/23    Progress Note Due on Visit 10    PT Start Time 1427    PT Stop Time 1507    PT Time Calculation (min) 40 min    Activity Tolerance Patient tolerated treatment well    Behavior During Therapy [facility] for tasks assessed/performed                Past Medical History:  Diagnosis Date   Dementia (HCC)    Diverticulitis    Hypercholesteremia    Hypertension    Osteoporosis    Thyroid disease    Past Surgical History:  Procedure Laterality Date   NOSE SURGERY     TRANSURETHRAL RESECTION OF PROSTATE     Patient Active Problem List   Diagnosis Date Noted   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   Regular astigmatism of left eye 09/04/2021   Posterior vitreous detachment of right eye 07/16/2021   Macular hole of left eye 01/04/2021   Vitreomacular traction syndrome of both eyes 01/04/2021   Aortic dissection (HCC) 08/15/2020   Bilateral hearing loss 02/28/2020   Vertigo 02/28/2020   Gait difficulty 02/16/2018   Idiopathic peripheral neuropathy 02/16/2018   Memory loss 02/16/2018   Mixed dementia (HCC) 02/16/2018   Senile nuclear sclerosis 03/21/2017   Risk for falls 06/26/2016   Rhinitis, chronic 05/21/2016   Sensorineural hearing loss (SNHL) of both ears 05/21/2016   Screening for malignant neoplasm of prostate 03/22/2016   Encounter for general adult medical examination without abnormal findings 03/22/2016   Morbid obesity (HCC) 03/22/2016   Mild cognitive impairment 01/22/2016   Benign non-nodular prostatic 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 lower urinary tract symptoms 01/17/2016   Irritability and anger  10/17/2015   Osteoporosis 10/17/2015   Acquired hypothyroidism 10/16/2015   Diverticulosis of large intestine 10/16/2015   Essential hypertension 10/16/2015   GERD without esophagitis 10/16/2015   Hyperlipidemia LDL goal <100 10/16/2015   Obstructive sleep apnea (adult) (pediatric) 10/16/2015   Frequency 07/29/2014    PCP: Creola Corn MD   REFERRING PROVIDER: Kirtland Bouchard, PA-C  REFERRING DIAG:  Diagnosis  M54.50 (ICD-10-CM) - Acute right-sided low back pain without sciatica    Rationale for Evaluation and Treatment: Rehabilitation  THERAPY DIAG:  Other low back pain  Other symptoms and signs involving the musculoskeletal system  Difficulty in walking, not elsewhere classified  ONSET DATE: "awhile back, maybe a couple months"   SUBJECTIVE:  SUBJECTIVE STATEMENT: Doing pretty good overall, wants to continue with PT a little longer  EVAL: They say its my back but it's really my hip. Can't remember exactly when it started but maybe a couple months ago. Larey Seat off the lawnmower 3 weeks ago, was real sore but its gone now. Hip was bothering me real bad this morning, can be stiff in the mornings. I'm a couch potato, I need to get moving but I haven't yet. I'm not interested in coming to PT period, but I'll leave it up to the captain there (referring to spouse).   PERTINENT HISTORY:   Lumbar spine 2 views: Loss of lordotic curvature.  No spondylolisthesis.   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  Scoliosis present.  No acute  fractures.  PAIN:  Are you having pain?   PRECAUTIONS: Other: hx of aortic an monitored by medical team   RED FLAGS: None   WEIGHT BEARING RESTRICTIONS: No  FALLS:  Has patient fallen in last 6 months? Yes. Number of falls 1 off of lawnmower, no FOF    LIVING ENVIRONMENT: Lives with: lives with their family Lives in: House/apartment Stairs: back no steps, front 6-8 STE U rail  Has following equipment at home: Single point cane and Environmental consultant - 2 wheeled  OCCUPATION: retired- used Psychologist, [employer]  PLOF: Independent, Independent with basic ADLs, Independent with gait, and Independent with transfers  PATIENT GOALS: "get PT done and over with and get back on the road", be pain free   NEXT MD VISIT: Referring PRN   OBJECTIVE:  Note: Objective measures were completed at Evaluation unless otherwise noted.    PATIENT SURVEYS:  FOTO 68, predicted 70 in 10 visits    COGNITION: Overall cognitive status: Within functional limits for tasks assessed     SENSATION: Not tested  MUSCLE LENGTH:  Hamstrings and piriformis severely limited  Hip flexors severe limitation per functional observation    LUMBAR ROM:   AROM eval 10/28/23  Flexion Moderate limitation  75%  Extension Severe limitation  50%  Right lateral flexion Severe limitation  50%  Left lateral flexion Severe limitation  75%  Right rotation Severe limitation 75%  Left rotation Severe limitation  75%   (Blank rows = not tested)    LOWER EXTREMITY MMT:    MMT Right eval Left eval  Hip flexion 4+ 4+  Hip extension At least 3 At least 3  Hip abduction 4+ 4+  Hip adduction    Hip internal rotation    Hip external rotation    Knee flexion 5 5  Knee extension 5 5  Ankle dorsiflexion 5 5  Ankle plantarflexion    Ankle inversion    Ankle eversion     (Blank rows = not tested)    TODAY'S TREATMENT:  DATE: 10/30/23 TherEx Leg press DL 16# [id number] Sidestepping in bars without UE support 4 round trips Seated row machine 25 #2X15 Seated chest press machine 20# 2X15 Low trunk rotations 5 sec X 10 bilat Supine clams blue X15 Bridges x15  hold 5 sec TA set + dead bugs x10 B Piriformis stretch 2x30 seconds B  Seated TA set + march x10 B Nustep L4 x10 minutes BLEs only for w/u, general conditioning, tissue perfusion  10/28/23  TherEx Low trunk rotations 5 sec X 10 bilat Supine clams blue 2X10 Bridges x15 hold 5 sec TA set + dead bugs x10 B Piriformis stretch 2x30 seconds B  Hip flexor stretch 2x30 seconds B Seated TA set + march x10 B Nustep L3 x10 minutes BLEs only for w/u, general conditioning, tissue perfusion  10/24/23  TherEx  Nustep L3 x10 minutes BLEs only for w/u, general conditioning, tissue perfusion Bridges x15  Sidelying clams green TB x12 B Bridges + ABD into green TB x12  TA set + dead bugs x10 B Piriformis stretch 2x30 seconds B  Hip flexor stretch 2x30 seconds B QL stretch 3 way x30 each Seated TA set + march x10 B     PATIENT EDUCATION:  Education details: exam findings, POC Person educated: Patient Education method: Explanation Education comprehension: needs further education  HOME EXERCISE PROGRAM: Access Code: [email or web address] URL: [email or web address] Date: 10/06/2023 Prepared by: Nedra Hai  Exercises - Hooklying Single Knee to Chest Stretch  - 1 x daily - 7 x weekly - 2 sets - 5 reps - 5 seconds  hold - Supine Lower Trunk Rotation  - 1 x daily - 7 x weekly - 2 sets - 5 reps - 5 seconds hold - Supine Transversus Abdominis Bracing - Hands on Stomach  - 3 x daily - 7 x weekly - 1 sets - 10 reps - 5 seconds  hold - Seated Piriformis Stretch  - 1 x daily - 7 x weekly - 2 sets - 2 reps - 30 seconds  hold  ASSESSMENT:  CLINICAL IMPRESSION: He has decided to continue with PT as recommended by PT as he has not yet met PT goals. Progressed his strength program today and he did show good tolerance to this.   EVAL: Patient is a 78 y.o. M who was seen today for physical therapy evaluation and treatment for  Diagnosis  M54.50 (ICD-10-CM) - Acute right-sided low back pain  without sciatica  . Exam with objective findings as above, of note pt did arrive late and then spent time completing FOTO survey so formal PT evaluation quite limited today. Will make every effort to improve objective findings and subjective concerns moving forward.   OBJECTIVE IMPAIRMENTS: Abnormal gait, decreased activity tolerance, decreased mobility, difficulty walking, decreased ROM, decreased strength, impaired flexibility, postural dysfunction, obesity, and pain.   ACTIVITY LIMITATIONS: carrying, lifting, standing, sleeping, transfers, bed mobility, and locomotion level  PARTICIPATION LIMITATIONS: driving, shopping, community activity, and yard work  PERSONAL FACTORS: Age, Behavior pattern, Education, Fitness, and Time since onset of injury/illness/exacerbation are also affecting patient's functional outcome.   REHAB POTENTIAL: Poor sedentary lifestyle, poor buy in to PT/very low interest in completing physical therapy   CLINICAL DECISION MAKING: Stable/uncomplicated  EVALUATION COMPLEXITY: Low   GOALS: Goals reviewed with patient? No  SHORT TERM GOALS: Target date: 11/25/2023    Will be compliant with appropriate progressive HEP  Baseline: Goal status: ongoing 10/28/23  2.  Pain to be no more than 6/10 at worst  Baseline:  Goal status: ongoing 10/28/23  3.  Lumbar ROM to be no more than 50% limited all planes of motion Baseline:  Goal status: ongoing 10/28/23  4.  LE muscle flexibility to have improved by at least 50%  Baseline:  Goal status: ongoing 10/28/23    LONG TERM GOALS: Target date: LTGs = STGs    PLAN:  PT FREQUENCY: 1-2x/week  PT DURATION: 3 weeks  PLANNED INTERVENTIONS: 97164- PT Re-evaluation, 97110-Therapeutic exercises, 97530- Therapeutic activity, [id number]- Neuromuscular re-education, 97535- Self Care, 16109- Manual therapy, [id number]- Aquatic Therapy, 97014- Electrical stimulation (unattended), [phone]- Ionotophoresis 4mg /ml Dexamethasone, Taping, Dry  Needling, Joint mobilization, Cryotherapy, and Moist heat.  PLAN FOR NEXT SESSION: Lumbar and hip/LE flexibility, core strength as tolerated up to 3 more weeks.   Ivery Quale, PT, DPT 10/30/23 3:06 PM   Date of referral: 08/07/23 Referring provider: Richardean Canal PA-C  Referring diagnosis?  Diagnosis  M54.50 (ICD-10-CM) - Acute right-sided low back pain without sciatica   Treatment diagnosis? (if different than referring diagnosis)   M54.59  R29.898  What was this (referring dx) caused by? Ongoing Issue  Ashby Dawes of Condition: Initial Onset (within last 3 months)   Laterality: Rt  Current Functional Measure Score: FOTO 68  Objective measurements identify impairments when they are compared to normal values, the uninvolved extremity, and prior level of function.  [x]  Yes  []  No  Objective assessment of functional ability: Minimal functional limitations   Briefly describe symptoms: stiffness in R hip more than lumbar pain  How did symptoms start: insidiously   Average pain intensity:  Last 24 hours: 5/10  Past week: 5/10  How often does the pt experience symptoms? Constantly  How much have the symptoms interfered with usual daily activities? A little bit  How has condition changed since care began at this facility? No change  In general, how is the patients overall health? Fair   BACK PAIN (STarT Back Screening Tool) Has pain spread down the leg(s) at some time in the last 2 weeks? No  Has there been pain in the shoulder or neck at some time in the last 2 weeks? No  Has the pt only walked short distances because of back pain? Yes  Has patient dressed more slowly because of back pain in the past 2 weeks? No  Does patient think it's not safe for a person with this condition to be physically active? No  Does patient have worrying thoughts a lot of the time? No  Does patient feel back pain is terrible and will never get any better? No  Has patient stopped enjoying  things they usually enjoy? No    PHYSICAL THERAPY DISCHARGE SUMMARY  Visits from Start of Care: 5  Current functional level related to goals / functional outcomes: See above   Remaining deficits: See above   Education / Equipment: HEP  Plan:  Patient goals were not met. Patient is being discharged due to not returning since last visit >30 days  Ivery Quale, PT, DPT 01/28/24 3:44 PM

## 2023-11-04 ENCOUNTER — Encounter: Payer: Medicare Other | Admitting: Physical Therapy

## 2023-11-06 ENCOUNTER — Encounter: Payer: Medicare Other | Admitting: Physical Therapy

## 2023-11-11 ENCOUNTER — Telehealth: Payer: Self-pay | Admitting: Physical Therapy

## 2023-11-11 ENCOUNTER — Encounter: Payer: Medicare Other | Admitting: Physical Therapy

## 2023-11-11 NOTE — Telephone Encounter (Signed)
Pt did not show for PT appointment today. They were contacted and informed of this via voicemail. They were provided the date and time of their next appointment on voicemail. They were instructed to call us to let us know if anything has changed with their plan with PT.  Ivery Quale, PT, DPT 11/11/23 2:06 PM

## 2023-11-13 ENCOUNTER — Encounter: Payer: Medicare Other | Admitting: Physical Therapy

## 2023-11-13 DIAGNOSIS — M810 Age-related osteoporosis without current pathological fracture: Secondary | ICD-10-CM | POA: Diagnosis not present

## 2023-11-13 DIAGNOSIS — E039 Hypothyroidism, unspecified: Secondary | ICD-10-CM | POA: Diagnosis not present

## 2023-11-13 DIAGNOSIS — E785 Hyperlipidemia, unspecified: Secondary | ICD-10-CM | POA: Diagnosis not present

## 2023-11-13 DIAGNOSIS — Z1212 Encounter for screening for malignant neoplasm of rectum: Secondary | ICD-10-CM | POA: Diagnosis not present

## 2023-11-13 DIAGNOSIS — I10 Essential (primary) hypertension: Secondary | ICD-10-CM | POA: Diagnosis not present

## 2023-11-13 DIAGNOSIS — N401 Enlarged prostate with lower urinary tract symptoms: Secondary | ICD-10-CM | POA: Diagnosis not present

## 2023-11-20 DIAGNOSIS — E785 Hyperlipidemia, unspecified: Secondary | ICD-10-CM | POA: Diagnosis not present

## 2023-11-20 DIAGNOSIS — I714 Abdominal aortic aneurysm, without rupture, unspecified: Secondary | ICD-10-CM | POA: Diagnosis not present

## 2023-11-20 DIAGNOSIS — F03B0 Unspecified dementia, moderate, without behavioral disturbance, psychotic disturbance, mood disturbance, and anxiety: Secondary | ICD-10-CM | POA: Diagnosis not present

## 2023-11-20 DIAGNOSIS — E039 Hypothyroidism, unspecified: Secondary | ICD-10-CM | POA: Diagnosis not present

## 2023-11-20 DIAGNOSIS — I10 Essential (primary) hypertension: Secondary | ICD-10-CM | POA: Diagnosis not present

## 2023-11-20 DIAGNOSIS — Z1339 Encounter for screening examination for other mental health and behavioral disorders: Secondary | ICD-10-CM | POA: Diagnosis not present

## 2023-11-20 DIAGNOSIS — R1013 Epigastric pain: Secondary | ICD-10-CM | POA: Diagnosis not present

## 2023-11-20 DIAGNOSIS — R82998 Other abnormal findings in urine: Secondary | ICD-10-CM | POA: Diagnosis not present

## 2023-11-20 DIAGNOSIS — F419 Anxiety disorder, unspecified: Secondary | ICD-10-CM | POA: Diagnosis not present

## 2023-11-20 DIAGNOSIS — M810 Age-related osteoporosis without current pathological fracture: Secondary | ICD-10-CM | POA: Diagnosis not present

## 2023-11-20 DIAGNOSIS — Z0000 Encounter for general adult medical examination without abnormal findings: Secondary | ICD-10-CM | POA: Diagnosis not present

## 2023-11-20 DIAGNOSIS — Z1331 Encounter for screening for depression: Secondary | ICD-10-CM | POA: Diagnosis not present

## 2023-11-21 DIAGNOSIS — G4733 Obstructive sleep apnea (adult) (pediatric): Secondary | ICD-10-CM | POA: Diagnosis not present

## 2023-12-07 NOTE — Progress Notes (Unsigned)
Cardiology Office Note:    Date:  12/11/2023   ID:  Steve [name], DOB 10-31-1945, MRN [id number]  PCP:  Creola Corn, MD  Cardiologist:  Little Ishikawa, MD  Electrophysiologist:  None   Referring MD: Creola Corn, MD   Chief Complaint  Patient presents with   Coronary Artery Disease    History of Present Illness:    Steve [name] is a 78 y.o. male with a hx of hypertension, hyperlipidemia, hypothyroidism, aortic dissection, OSA on CPAP who presents for follow-up.  He was referred by Dr. Lorenso Quarry for evaluation of chest pain, initially seen 07/29/2023.  Follows with Dr. Chestine Spore for aortic dissection, last seen 07/2022.  Had an incidental finding of infrarenal aortic dissection 05/2020.  He had presented with diverticulitis and had evidence of dental finding of infrarenal aortic dissection.  Also with stable 3.1 cm infrarenal abdominal aortic aneurysm.  Coronary CTA on 08/12/2023 showed nonobstructive CAD with 25 to 49% stenosis in proximal RCA, RPLA and ramus, calcium score 145 (36 percentile), ascending aortic aneurysm measuring 45 mm.  Echocardiogram 09/08/2023 showed EF 55 to 60%, normal RV function, mild aortic regurgitation, ascending aortic dilatation measuring 43 mm.  Since last clinic visit, he reports he is doing okay.  Denies any recent chest pain.  Denies any dyspnea, lightheadedness, syncope, lower extremity edema, or palpitations.  Reports compliance with CPAP.  Has not been exercising    Past Medical History:  Diagnosis Date   Dementia (HCC)    Diverticulitis    Hypercholesteremia    Hypertension    Osteoporosis    Thyroid disease     Past Surgical History:  Procedure Laterality Date   NOSE SURGERY     TRANSURETHRAL RESECTION OF PROSTATE      Current Medications: Current Meds  Medication Sig   ALPRAZolam (XANAX) 0.25 MG tablet Take 0.25 mg by mouth 2 (two) times daily as needed.   Ascorbic Acid (VITAMIN C) 1000 MG tablet Take 500 mg by mouth daily.    aspirin EC 81 MG tablet Take 325 mg by mouth daily.   B Complex Vitamins (VITAMIN B-COMPLEX) TABS Take 1 tablet by mouth daily.   cholecalciferol (VITAMIN D) 1000 UNITS tablet Take 1,000 Units by mouth daily.   cyanocobalamin 100 MCG tablet Take 100 mcg by mouth daily.   escitalopram (LEXAPRO) 10 MG tablet Take 10 mg by mouth daily.   fish oil-omega-3 fatty acids 1000 MG capsule Take 1-2 g by mouth 2 (two) times daily. Take 1 capsule in the morning and 2 capsules in the evening   fluticasone (FLONASE) 50 MCG/ACT nasal spray Place 2 sprays into both nostrils daily.   levothyroxine (SYNTHROID) 125 MCG tablet    lisinopril (PRINIVIL,ZESTRIL) 10 MG tablet Take 10 mg by mouth daily.   memantine (NAMENDA) 10 MG tablet    montelukast (SINGULAIR) 10 MG tablet Take 10 mg by mouth at bedtime.   MYRBETRIQ 50 MG TB24 tablet    pantoprazole (PROTONIX) 40 MG tablet    simvastatin (ZOCOR) 40 MG tablet Take 40 mg by mouth every evening.   sucralfate (CARAFATE) 1 g tablet Take 1 g by mouth 2 (two) times daily as needed.   zoledronic acid (RECLAST) 5 MG/100ML SOLN injection Inject 5 mg into the vein once.     Allergies:   Donepezil, Benzonatate, Penicillin g, and Penicillins   Social History   Socioeconomic History   Marital status: Married    Spouse name: Not on file   Number of children: Not  on file   Years of education: Not on file   Highest education level: Not on file  Occupational History   Not on file  Tobacco Use   Smoking status: Never   Smokeless tobacco: Never  Vaping Use   Vaping status: Never Used  Substance and Sexual Activity   Alcohol use: No   Drug use: No   Sexual activity: Not Currently    Partners: Female    Comment: MARRED  Other Topics Concern   Not on file  Social History Narrative   Not on file   Social Drivers of Health   Financial Resource Strain: Not on file  Food Insecurity: No Food Insecurity (11/22/2020)   Received from [facility], [facility]    Hunger Vital Sign    Worried About Running Out of Food in the Last Year: Never true    Ran Out of Food in the Last Year: Never true  Transportation Needs: Not on file  Physical Activity: Not on file  Stress: Not on file  Social Connections: Unknown (03/04/2022)   Received from [facility], [facility]   Social Network    Social Network: Not on file     Family History: The patient's family history includes Alzheimer's disease in his mother; Cancer in his father.  ROS:   Please see the history of present illness.     All other systems reviewed and are negative.  EKGs/Labs/Other Studies Reviewed:    The following studies were reviewed today:   EKG:   07/29/23: Normal sinus rhythm, rate 69, no ST abnormalities  Recent Labs: 02/21/2023: ALT 27; Hemoglobin 14.2; Platelets 247 07/29/2023: BUN 15; Creatinine, Ser 0.97; Potassium 5.0; Sodium 140  Recent Lipid Panel    Component Value Date/Time   CHOL 136 07/29/2023 1610   TRIG 195 (H) 07/29/2023 1610   HDL 32 (L) 07/29/2023 1610   CHOLHDL 4.3 07/29/2023 1610   LDLCALC 71 07/29/2023 1610    Physical Exam:    VS:  BP 120/68   Pulse 83   Ht 5\' 11"  (1.803 m)   Wt (!) 320 lb 12.8 oz (145.5 kg)   SpO2 92%   BMI 44.74 kg/m     Wt Readings from Last 3 Encounters:  12/11/23 (!) 320 lb 12.8 oz (145.5 kg)  09/30/23 (!) 315 lb 9.6 oz (143.2 kg)  07/29/23 (!) 319 lb 6.4 oz (144.9 kg)     GEN:  Well nourished, well developed in no acute distress HEENT: Normal NECK: No JVD; No carotid bruits LYMPHATICS: No lymphadenopathy CARDIAC: RRR, no murmurs, rubs, gallops RESPIRATORY:  Clear to auscultation without rales, wheezing or rhonchi  ABDOMEN: Soft, non-tender, non-distended MUSCULOSKELETAL:  No edema; No deformity  SKIN: Warm and dry NEUROLOGIC:  Alert and oriented x 3 PSYCHIATRIC:  Normal affect   ASSESSMENT:    1. Coronary artery disease involving native coronary artery of native heart without angina pectoris   2. Morbid  obesity (HCC)   3. Dissection of abdominal aorta (HCC)   4. Essential hypertension   5. Hyperlipidemia, unspecified hyperlipidemia type     PLAN:    CAD: Reported atypical chest pain.  Coronary CTA on 08/12/2023 showed nonobstructive CAD with 25 to 49% stenosis in proximal RCA, RPLA and ramus, calcium score 145 (36 percentile), ascending aortic aneurysm measuring 45 mm.  Echocardiogram 09/08/2023 showed EF 55 to 60%, normal RV function, mild aortic regurgitation, ascending aortic dilatation measuring 43 mm. -Continue simvastatin 40 mg daily  Aortic dissection: Follows with  Dr. Chestine Spore for aortic dissection, last seen 09/2023.  Had an incidental finding of infrarenal aortic dissection 05/2020.  He had presented with diverticulitis and had evidence of incidental finding of infrarenal aortic dissection.  Also with 3.3 cm infrarenal abdominal aortic aneurysm.  Ascending aortic aneurysm: Measured 4.5 cm on CTA 07/2023, will monitor  Hypertension: On lisinopril 10 mg daily.  Appears controlled  Hyperlipidemia: On simvastatin 40 mg daily.  LDL 72 on 11/13/2023  OSA: reports compliance with CPAP  Morbid obesity: Body mass index is 44.74 kg/m.  Will refer to healthy weight and wellness  RTC in 1 year Medication Adjustments/Labs and Tests Ordered: Current medicines are reviewed at length with the patient today.  Concerns regarding medicines are outlined above.  Orders Placed This Encounter  Procedures   Amb Ref to Medical Weight Management   No orders of the defined types were placed in this encounter.   Patient Instructions  Medication Instructions:  Continue current medication *If you need a refill on your cardiac medications before your next appointment, please call your pharmacy*   Lab Work: none If you have labs (blood work) drawn today and your tests are completely normal, you will receive your results only by: MyChart Message (if you have MyChart) OR A paper copy in the mail If  you have any lab test that is abnormal or we need to change your treatment, we will call you to review the results.   Testing/Procedures: none   Follow-Up: At [facility], you and your health needs are our priority.  As part of our continuing mission to provide you with exceptional heart care, we have created designated Provider Care Teams.  These Care Teams include your primary Cardiologist (physician) and Advanced Practice Providers (APPs -  Physician Assistants and Nurse Practitioners) who all work together to provide you with the care you need, when you need it.  We recommend signing up for the patient portal called "MyChart".  Sign up information is provided on this After Visit Summary.  MyChart is used to connect with patients for Virtual Visits (Telemedicine).  Patients are able to view lab/test results, encounter notes, upcoming appointments, etc.  Non-urgent messages can be sent to your provider as well.   To learn more about what you can do with MyChart, go to [email or web address].    Your next appointment:   1 year(s)  Provider:   Little Ishikawa, MD     Other Instructions  [facility] [place]:          Signed, Little Ishikawa, MD  12/11/2023 2:38 PM    [facility]

## 2023-12-11 ENCOUNTER — Ambulatory Visit: Payer: PPO | Attending: Cardiology | Admitting: Cardiology

## 2023-12-11 ENCOUNTER — Encounter: Payer: Self-pay | Admitting: Cardiology

## 2023-12-11 VITALS — BP 120/68 | HR 83 | Ht 71.0 in | Wt 320.8 lb

## 2023-12-11 DIAGNOSIS — I10 Essential (primary) hypertension: Secondary | ICD-10-CM | POA: Diagnosis not present

## 2023-12-11 DIAGNOSIS — E785 Hyperlipidemia, unspecified: Secondary | ICD-10-CM

## 2023-12-11 DIAGNOSIS — I2510 Atherosclerotic heart disease of native coronary artery without angina pectoris: Secondary | ICD-10-CM | POA: Diagnosis not present

## 2023-12-11 DIAGNOSIS — I7102 Dissection of abdominal aorta: Secondary | ICD-10-CM | POA: Diagnosis not present

## 2023-12-11 NOTE — Patient Instructions (Signed)
Medication Instructions:  Continue current medication *If you need a refill on your cardiac medications before your next appointment, please call your pharmacy*   Lab Work: none If you have labs (blood work) drawn today and your tests are completely normal, you will receive your results only by: MyChart Message (if you have MyChart) OR A paper copy in the mail If you have any lab test that is abnormal or we need to change your treatment, we will call you to review the results.   Testing/Procedures: none   Follow-Up: At [facility], you and your health needs are our priority.  As part of our continuing mission to provide you with exceptional heart care, we have created designated Provider Care Teams.  These Care Teams include your primary Cardiologist (physician) and Advanced Practice Providers (APPs -  Physician Assistants and Nurse Practitioners) who all work together to provide you with the care you need, when you need it.  We recommend signing up for the patient portal called "MyChart".  Sign up information is provided on this After Visit Summary.  MyChart is used to connect with patients for Virtual Visits (Telemedicine).  Patients are able to view lab/test results, encounter notes, upcoming appointments, etc.  Non-urgent messages can be sent to your provider as well.   To learn more about what you can do with MyChart, go to [email or web address].    Your next appointment:   1 year(s)  Provider:   Little Ishikawa, MD     Other Instructions  [facility]:

## 2023-12-25 DIAGNOSIS — C44629 Squamous cell carcinoma of skin of left upper limb, including shoulder: Secondary | ICD-10-CM | POA: Diagnosis not present

## 2023-12-25 DIAGNOSIS — D1801 Hemangioma of skin and subcutaneous tissue: Secondary | ICD-10-CM | POA: Diagnosis not present

## 2023-12-25 DIAGNOSIS — Z85828 Personal history of other malignant neoplasm of skin: Secondary | ICD-10-CM | POA: Diagnosis not present

## 2023-12-25 DIAGNOSIS — L738 Other specified follicular disorders: Secondary | ICD-10-CM | POA: Diagnosis not present

## 2023-12-25 DIAGNOSIS — L918 Other hypertrophic disorders of the skin: Secondary | ICD-10-CM | POA: Diagnosis not present

## 2023-12-25 DIAGNOSIS — L821 Other seborrheic keratosis: Secondary | ICD-10-CM | POA: Diagnosis not present

## 2023-12-25 DIAGNOSIS — L300 Nummular dermatitis: Secondary | ICD-10-CM | POA: Diagnosis not present

## 2023-12-25 DIAGNOSIS — L820 Inflamed seborrheic keratosis: Secondary | ICD-10-CM | POA: Diagnosis not present

## 2024-01-02 DIAGNOSIS — K219 Gastro-esophageal reflux disease without esophagitis: Secondary | ICD-10-CM | POA: Diagnosis not present

## 2024-01-02 DIAGNOSIS — Z860100 Personal history of colon polyps, unspecified: Secondary | ICD-10-CM | POA: Diagnosis not present

## 2024-02-14 DIAGNOSIS — G4733 Obstructive sleep apnea (adult) (pediatric): Secondary | ICD-10-CM | POA: Diagnosis not present

## 2024-02-23 DIAGNOSIS — G4733 Obstructive sleep apnea (adult) (pediatric): Secondary | ICD-10-CM | POA: Diagnosis not present

## 2024-02-23 DIAGNOSIS — Z1152 Encounter for screening for COVID-19: Secondary | ICD-10-CM | POA: Diagnosis not present

## 2024-02-23 DIAGNOSIS — J3489 Other specified disorders of nose and nasal sinuses: Secondary | ICD-10-CM | POA: Diagnosis not present

## 2024-02-23 DIAGNOSIS — R058 Other specified cough: Secondary | ICD-10-CM | POA: Diagnosis not present

## 2024-02-23 DIAGNOSIS — R5383 Other fatigue: Secondary | ICD-10-CM | POA: Diagnosis not present

## 2024-02-23 DIAGNOSIS — J310 Chronic rhinitis: Secondary | ICD-10-CM | POA: Diagnosis not present

## 2024-03-11 ENCOUNTER — Other Ambulatory Visit: Payer: Self-pay | Admitting: Adult Health

## 2024-03-11 ENCOUNTER — Ambulatory Visit
Admission: RE | Admit: 2024-03-11 | Discharge: 2024-03-11 | Disposition: A | Discharge status: Home / Self Care | Source: Ambulatory Visit | Attending: Adult Health | Admitting: Adult Health

## 2024-03-11 DIAGNOSIS — G4733 Obstructive sleep apnea (adult) (pediatric): Secondary | ICD-10-CM | POA: Diagnosis not present

## 2024-03-11 DIAGNOSIS — R109 Unspecified abdominal pain: Secondary | ICD-10-CM | POA: Diagnosis not present

## 2024-03-11 DIAGNOSIS — I7143 Infrarenal abdominal aortic aneurysm, without rupture: Secondary | ICD-10-CM | POA: Diagnosis not present

## 2024-03-11 DIAGNOSIS — N50819 Testicular pain, unspecified: Secondary | ICD-10-CM | POA: Diagnosis not present

## 2024-03-11 DIAGNOSIS — R3129 Other microscopic hematuria: Secondary | ICD-10-CM

## 2024-03-11 DIAGNOSIS — K760 Fatty (change of) liver, not elsewhere classified: Secondary | ICD-10-CM | POA: Diagnosis not present

## 2024-03-11 DIAGNOSIS — N390 Urinary tract infection, site not specified: Secondary | ICD-10-CM | POA: Diagnosis not present

## 2024-03-11 DIAGNOSIS — K5730 Diverticulosis of large intestine without perforation or abscess without bleeding: Secondary | ICD-10-CM | POA: Diagnosis not present

## 2024-03-11 DIAGNOSIS — I700 Atherosclerosis of aorta: Secondary | ICD-10-CM | POA: Diagnosis not present

## 2024-03-11 DIAGNOSIS — R058 Other specified cough: Secondary | ICD-10-CM | POA: Diagnosis not present

## 2024-03-11 DIAGNOSIS — J310 Chronic rhinitis: Secondary | ICD-10-CM | POA: Diagnosis not present

## 2024-03-26 DIAGNOSIS — R350 Frequency of micturition: Secondary | ICD-10-CM | POA: Diagnosis not present

## 2024-03-26 DIAGNOSIS — R351 Nocturia: Secondary | ICD-10-CM | POA: Diagnosis not present

## 2024-03-26 DIAGNOSIS — N50811 Right testicular pain: Secondary | ICD-10-CM | POA: Diagnosis not present

## 2024-04-21 DIAGNOSIS — N50811 Right testicular pain: Secondary | ICD-10-CM | POA: Diagnosis not present

## 2024-04-21 DIAGNOSIS — R350 Frequency of micturition: Secondary | ICD-10-CM | POA: Diagnosis not present

## 2024-05-10 DIAGNOSIS — G4733 Obstructive sleep apnea (adult) (pediatric): Secondary | ICD-10-CM | POA: Diagnosis not present

## 2024-05-20 DIAGNOSIS — H35039 Hypertensive retinopathy, unspecified eye: Secondary | ICD-10-CM | POA: Diagnosis not present

## 2024-05-20 DIAGNOSIS — H269 Unspecified cataract: Secondary | ICD-10-CM | POA: Diagnosis not present

## 2024-05-20 DIAGNOSIS — M810 Age-related osteoporosis without current pathological fracture: Secondary | ICD-10-CM | POA: Diagnosis not present

## 2024-05-20 DIAGNOSIS — F03B0 Unspecified dementia, moderate, without behavioral disturbance, psychotic disturbance, mood disturbance, and anxiety: Secondary | ICD-10-CM | POA: Diagnosis not present

## 2024-05-20 DIAGNOSIS — F419 Anxiety disorder, unspecified: Secondary | ICD-10-CM | POA: Diagnosis not present

## 2024-05-20 DIAGNOSIS — I714 Abdominal aortic aneurysm, without rupture, unspecified: Secondary | ICD-10-CM | POA: Diagnosis not present

## 2024-05-20 DIAGNOSIS — Z8616 Personal history of COVID-19: Secondary | ICD-10-CM | POA: Diagnosis not present

## 2024-05-20 DIAGNOSIS — Z6841 Body Mass Index (BMI) 40.0 and over, adult: Secondary | ICD-10-CM | POA: Diagnosis not present

## 2024-05-20 DIAGNOSIS — E039 Hypothyroidism, unspecified: Secondary | ICD-10-CM | POA: Diagnosis not present

## 2024-05-20 DIAGNOSIS — I10 Essential (primary) hypertension: Secondary | ICD-10-CM | POA: Diagnosis not present

## 2024-05-20 DIAGNOSIS — F325 Major depressive disorder, single episode, in full remission: Secondary | ICD-10-CM | POA: Diagnosis not present

## 2024-06-01 ENCOUNTER — Ambulatory Visit (INDEPENDENT_AMBULATORY_CARE_PROVIDER_SITE_OTHER): Admitting: Neurology

## 2024-06-01 ENCOUNTER — Encounter: Payer: Self-pay | Admitting: Neurology

## 2024-06-01 VITALS — BP 113/72 | HR 81 | Ht 70.0 in | Wt 324.2 lb

## 2024-06-01 DIAGNOSIS — G4719 Other hypersomnia: Secondary | ICD-10-CM

## 2024-06-01 DIAGNOSIS — Z6841 Body Mass Index (BMI) 40.0 and over, adult: Secondary | ICD-10-CM

## 2024-06-01 DIAGNOSIS — R351 Nocturia: Secondary | ICD-10-CM | POA: Diagnosis not present

## 2024-06-01 DIAGNOSIS — G4733 Obstructive sleep apnea (adult) (pediatric): Secondary | ICD-10-CM | POA: Diagnosis not present

## 2024-06-01 NOTE — Patient Instructions (Addendum)
 I will order a home sleep test to reevaluate you for your sleep apnea and to qualify you for a new machine as testing was many years ago.  At the time of testing you will not use your CPAP machine but other than that please be consistent with your current machine.  We will plan a follow-up after testing.  If you do not hear from us  within the next 2 weeks, please call us  or email us  through MyChart.

## 2024-06-01 NOTE — Progress Notes (Signed)
 Subjective:    Patient ID: Steve [name] is a 77 y.o. male.  HPI    True Mar, MD, PhD Steve [name] [address] [address] [place], [place] [address]  Dear Dr. Onita,  I saw your patient, Steve [name], upon your kind request in my sleep clinic today for initial consultation of his sleep disorder, in particular, evaluation of his prior diagnosis of obstructive sleep apnea.  The patient is accompanied by his wife today.  As you know, Steve [name] is a 78 year old male with an underlying medical history of reflux disease, osteoporosis, allergies, hypertension, hyperlipidemia, retinopathy, hypothyroidism, diverticulosis, dementia (followed by [name] neurology), depression, anxiety, hearing loss, and morbid obesity with a BMI of over 45, who was previously diagnosed with obstructive sleep apnea and placed on PAP therapy.  Prior sleep study results are not available for my review today.  His Epworth sleepiness score is.  I reviewed your office note from 03/11/2024.  He saw Steve Bohr, NP at the time.  I was able to review his PAP compliance data from the past 90 days.  He has been compliant with his CPAP of 10 cm with no EPR.  Residual AHI 1.6/h, leak on the higher side with a 95th percentile at 22 L/min.  Average usage of 7 hours and 48 minutes.  His DME company is [employer].  He has a ResMed air sense 10 CPAP machine. He is consistent with the CPAP he has, he has seen an error message on it, his wife reports that there is a motor life exceeded message on it.  Sometimes the mask leaks but overall he is doing well with it, would like to get a new machine, his current machine is from 2017.  He goes to bed around 2:30 AM, sometimes around 1:30 AM, has nocturia once, rise time around 11.  He lives with his wife.  He drinks little caffeine, usually decaf tea, 2 glasses/day and decaf coffee, 1 cup/day, occasional soda, nearly daily per wife.  He is a non-smoker and does  not currently drink any alcohol .  He takes Xanax as needed, he takes tizanidine  for hip spasms as needed.  He is no longer on Tranxene.  He had a tonsillectomy.  His son has sleep apnea.  His Past Medical History Is Significant For: Past Medical History:  Diagnosis Date   Dementia (HCC)    Diverticulitis    Hypercholesteremia    Hypertension    Osteoporosis    Thyroid  disease     His Past Surgical History Is Significant For: Past Surgical History:  Procedure Laterality Date   NOSE SURGERY     TRANSURETHRAL RESECTION OF PROSTATE      His Family History Is Significant For: Family History  Problem Relation Age of Onset   Alzheimer's disease Mother    Cancer Father    Sleep apnea Neg Hx     His Social History Is Significant For: Social History   Socioeconomic History   Marital status: Married    Spouse name: Not on file   Number of children: Not on file   Years of education: Not on file   Highest education level: Not on file  Occupational History   Not on file  Tobacco Use   Smoking status: Never   Smokeless tobacco: Never  Vaping Use   Vaping status: Never Used  Substance and Sexual Activity   Alcohol  use: No   Drug use: No   Sexual activity: Not Currently  Partners: Female    Comment: MARRED  Other Topics Concern   Not on file  Social History Narrative   Not on file   Social Drivers of Health   Financial Resource Strain: Not on file  Food Insecurity: No Food Insecurity (11/22/2020)   Received from [name]   Hunger Vital Sign    Within the past 12 months, you worried that your food would run out before you got the money to buy more.: Never true    Within the past 12 months, the food you bought just didn't last and you didn't have money to get more.: Never true  Transportation Needs: Not on file  Physical Activity: Not on file  Stress: Not on file  Social Connections: Unknown (03/04/2022)   Received from [name]   Social Network    Social  Network: Not on file    His Allergies Are:  Allergies  Allergen Reactions   Donepezil Diarrhea and Other (See Comments)    Abdominal pain Diarrhea Abdominal pain Diarrhea    Benzonatate Swelling    Tessalon pearls   Penicillin G Rash   Penicillins Rash  :   His Current Medications Are:  Outpatient Encounter Medications as of 06/01/2024  Medication Sig   ALPRAZolam (XANAX) 0.25 MG tablet Take 0.25 mg by mouth 2 (two) times daily as needed.   Ascorbic Acid (VITAMIN C) 1000 MG tablet Take 500 mg by mouth daily.   aspirin  EC 81 MG tablet Take 325 mg by mouth daily.   B Complex Vitamins (VITAMIN B-COMPLEX) TABS Take 1 tablet by mouth daily.   cholecalciferol (VITAMIN D) 1000 UNITS tablet Take 1,000 Units by mouth daily.   cyanocobalamin 100 MCG tablet Take 100 mcg by mouth daily.   escitalopram (LEXAPRO) 10 MG tablet Take 10 mg by mouth daily.   fish oil-omega-3 fatty acids 1000 MG capsule Take 1-2 g by mouth 2 (two) times daily. Take 1 capsule in the morning and 2 capsules in the evening   fluticasone (FLONASE) 50 MCG/ACT nasal spray Place 2 sprays into both nostrils daily.   levothyroxine (SYNTHROID) 125 MCG tablet    lisinopril (PRINIVIL,ZESTRIL) 10 MG tablet Take 10 mg by mouth daily.   memantine (NAMENDA) 10 MG tablet    montelukast (SINGULAIR) 10 MG tablet Take 10 mg by mouth at bedtime.   MYRBETRIQ 50 MG TB24 tablet    pantoprazole (PROTONIX) 40 MG tablet    simvastatin (ZOCOR) 40 MG tablet Take 40 mg by mouth every evening.   sucralfate (CARAFATE) 1 g tablet Take 1 g by mouth 2 (two) times daily as needed.   tiZANidine  (ZANAFLEX ) 2 MG tablet TAKE 1 TABLET(2 MG) BY MOUTH AT BEDTIME (Patient taking differently: as needed.)   zoledronic  acid (RECLAST ) 5 MG/100ML SOLN injection Inject 5 mg into the vein once.   clorazepate (TRANXENE) 7.5 MG tablet  (Patient not taking: Reported on 12/11/2023)   metoprolol  tartrate (LOPRESSOR ) 50 MG tablet Take 50 mg 2 hrs before Coronary CT  (Patient not taking: Reported on 06/01/2024)   tobramycin -dexamethasone  (TOBRADEX ) ophthalmic solution Place 1 drop into both eyes every 4 (four) hours while awake. For 5 days (Patient not taking: Reported on 12/11/2023)   traMADol  (ULTRAM ) 50 MG tablet Take 1 tablet (50 mg total) by mouth every 6 (six) hours as needed. (Patient not taking: Reported on 12/11/2023)   No facility-administered encounter medications on file as of 06/01/2024.  :   Review of Systems:  Out of a complete 14  point review of systems, all are reviewed and negative with the exception of these symptoms as listed below:   Review of Systems  Neurological:        Pt here for sleep consult Pt states wears cpap nightly . Pt states fatigue ,controlled BP, last sleep study 2018  sud 10/08/2016     Objective:  Neurological Exam  Physical Exam Physical Examination:   Vitals:   06/01/24 1428  BP: 113/72  Pulse: 81    General Examination: The patient is a very pleasant 78 y.o. male in no acute distress. He appears well-developed and well-nourished and well groomed.   HEENT: Normocephalic, atraumatic, pupils are equal, round and reactive to light, extraocular tracking is good without limitation to gaze excursion or nystagmus noted. Hearing is grossly intact. Face is symmetric with normal facial animation. Speech is clear with no dysarthria noted. There is no hypophonia. There is no lip, neck/head, jaw or voice tremor. Neck is supple with full range of passive and active motion. There are no carotid bruits on auscultation. Oropharynx exam reveals: mild mouth dryness, adequate dental hygiene and mild airway crowding, due to small airway entry and redundant soft palate.  Tongue protrudes centrally and palate elevates symmetrically, neck circumference 18 7/8 inches.  Mild overbite noted.    Chest: Clear to auscultation without wheezing, rhonchi or crackles noted.  Heart: S1+S2+0, regular and normal without murmurs, rubs or gallops  noted.   Abdomen: Soft, non-tender and non-distended.  Extremities: There is trace pitting edema in the distal lower extremities bilaterally.   Skin: Warm and dry without trophic changes noted.   Musculoskeletal: exam reveals no obvious joint deformities.   Neurologically:  Mental status: The patient is awake, alert and oriented in all 4 spheres. His immediate and remote memory, attention, language skills and fund of knowledge are fair.  Wife supplements his history.  Thought process is linear. Mood is normal and affect is normal.  Cranial nerves II - XII are as described above under HEENT exam.  Motor exam: Normal bulk, strength and tone is noted. There is no obvious action or resting tremor.  Fine motor skills and coordination: grossly intact.  Cerebellar testing: No dysmetria or intention tremor. There is no truncal or gait ataxia.  Sensory exam: intact to light touch in the upper and lower extremities.  Gait, station and balance: He walks somewhat slowly.   Assessment and Plan:  In summary, Steve [name] is a very pleasant 78 y.o.-year old male with an underlying medical history of reflux disease, osteoporosis, allergies, hypertension, hyperlipidemia, retinopathy, hypothyroidism, diverticulosis, dementia (followed by Steve [name] neurology), depression, anxiety, hearing loss, and morbid obesity with a BMI of over 45, who presents for evaluation of his obstructive sleep apnea of several years duration.  Testing was in or around 2017.  He has been on a CPAP machine and is consistent with treatment on a pressure of 10 cm without EPR.  Residual AHI is at goal but he has an older machine and would like to get a new machine.  While a laboratory attended sleep study is gold standard for sleep testing, we mutually agreed to proceed with a home sleep test at the time.  The patient is reminded not to use his CPAP at the time of testing at home.    I had a long chat with the patient and his wife about  diagnosis of sleep apnea, we talked about surgical and nonsurgical treatment options and mutually agreed to maintain treatment with PAP  therapy.  He has done well with his CPAP and tolerates treatment, apnea control is good.  We will plan to follow-up after testing.  I answered all their questions today and the patient and his wife were in agreement.  Thank you very much for allowing me to participate in the care of this nice patient. If I can be of any further assistance to you please do not hesitate to call me at [phone].  Sincerely,   True Mar, MD, PhD

## 2024-06-02 ENCOUNTER — Telehealth: Payer: Self-pay | Admitting: Neurology

## 2024-06-02 NOTE — Telephone Encounter (Signed)
 HST- HTA pending

## 2024-06-07 DIAGNOSIS — K219 Gastro-esophageal reflux disease without esophagitis: Secondary | ICD-10-CM | POA: Diagnosis not present

## 2024-06-07 DIAGNOSIS — R1013 Epigastric pain: Secondary | ICD-10-CM | POA: Diagnosis not present

## 2024-06-07 DIAGNOSIS — D124 Benign neoplasm of descending colon: Secondary | ICD-10-CM | POA: Diagnosis not present

## 2024-06-07 DIAGNOSIS — D123 Benign neoplasm of transverse colon: Secondary | ICD-10-CM | POA: Diagnosis not present

## 2024-06-09 DIAGNOSIS — H40003 Preglaucoma, unspecified, bilateral: Secondary | ICD-10-CM | POA: Diagnosis not present

## 2024-06-09 DIAGNOSIS — H04552 Acquired stenosis of left nasolacrimal duct: Secondary | ICD-10-CM | POA: Diagnosis not present

## 2024-06-09 DIAGNOSIS — H47393 Other disorders of optic disc, bilateral: Secondary | ICD-10-CM | POA: Diagnosis not present

## 2024-06-09 DIAGNOSIS — H04212 Epiphora due to excess lacrimation, left lacrimal gland: Secondary | ICD-10-CM | POA: Diagnosis not present

## 2024-06-14 NOTE — Telephone Encounter (Signed)
 HST HTA shara: [id number] (exp. 06/02/24 to 08/31/24)

## 2024-07-06 DIAGNOSIS — M810 Age-related osteoporosis without current pathological fracture: Secondary | ICD-10-CM | POA: Diagnosis not present

## 2024-07-06 DIAGNOSIS — I10 Essential (primary) hypertension: Secondary | ICD-10-CM | POA: Diagnosis not present

## 2024-07-09 ENCOUNTER — Other Ambulatory Visit (HOSPITAL_COMMUNITY): Payer: Self-pay | Admitting: Internal Medicine

## 2024-07-09 ENCOUNTER — Telehealth (HOSPITAL_COMMUNITY): Payer: Self-pay | Admitting: Pharmacy Technician

## 2024-07-09 NOTE — Telephone Encounter (Signed)
 Auth Submission: NO AUTH NEEDED Site of care: [facility] Payer: [employer]  Medication & CPT/J Code(s) submitted: Reclast  (Zolendronic acid) J3489 Diagnosis Code: M81.0 Route of submission (phone, fax, portal):  Phone # Fax # Auth type: Buy/Bill HB Units/visits requested: 5mg  x 1 dose, q 12 mths Reference number:  Approval from: 07/09/24 to 10/20/24    Dagoberto Armour, CPhT [facility] Phone: [phone] 07/09/2024

## 2024-07-20 ENCOUNTER — Telehealth: Payer: Self-pay | Admitting: Neurology

## 2024-07-20 ENCOUNTER — Ambulatory Visit (HOSPITAL_COMMUNITY)
Admission: RE | Admit: 2024-07-20 | Discharge: 2024-07-20 | Disposition: A | Discharge status: Home / Self Care | Source: Ambulatory Visit | Attending: Internal Medicine | Admitting: Internal Medicine

## 2024-07-20 VITALS — BP 107/74 | HR 70 | Temp 97.6°F | Resp 17

## 2024-07-20 DIAGNOSIS — M810 Age-related osteoporosis without current pathological fracture: Secondary | ICD-10-CM | POA: Insufficient documentation

## 2024-07-20 MED ORDER — DIPHENHYDRAMINE HCL 25 MG PO CAPS: 25.0000 mg | ORAL_CAPSULE | Freq: Once | ORAL | Status: DC

## 2024-07-20 MED ORDER — ZOLEDRONIC ACID 5 MG/100ML IV SOLN
5.0000 mg | Freq: Once | INTRAVENOUS | Status: AC
Start: 2024-07-20 — End: 2024-07-20
  Administered 2024-07-20: 5 mg via INTRAVENOUS

## 2024-07-20 MED ORDER — ZOLEDRONIC ACID 5 MG/100ML IV SOLN
INTRAVENOUS | Status: AC
  Filled 2024-07-20: qty 100

## 2024-07-20 MED ORDER — SODIUM CHLORIDE 0.9 % IV SOLN
INTRAVENOUS | Status: DC
Start: 2024-07-20 — End: 2024-07-21

## 2024-07-20 MED ORDER — ACETAMINOPHEN 325 MG PO TABS: 650.0000 mg | ORAL_TABLET | Freq: Once | ORAL | Status: DC

## 2024-07-20 NOTE — Telephone Encounter (Signed)
 Patient's wife, Negan Grudzien calling to schedule home sleep test. Would like a call back.

## 2024-07-21 ENCOUNTER — Encounter: Payer: Self-pay | Admitting: Neurology

## 2024-07-30 ENCOUNTER — Ambulatory Visit (INDEPENDENT_AMBULATORY_CARE_PROVIDER_SITE_OTHER): Admitting: Neurology

## 2024-07-30 ENCOUNTER — Other Ambulatory Visit: Payer: Self-pay | Admitting: Internal Medicine

## 2024-07-30 DIAGNOSIS — G4719 Other hypersomnia: Secondary | ICD-10-CM

## 2024-07-30 DIAGNOSIS — G4733 Obstructive sleep apnea (adult) (pediatric): Secondary | ICD-10-CM | POA: Diagnosis not present

## 2024-07-30 DIAGNOSIS — R351 Nocturia: Secondary | ICD-10-CM

## 2024-08-03 DIAGNOSIS — Z23 Encounter for immunization: Secondary | ICD-10-CM | POA: Diagnosis not present

## 2024-08-12 NOTE — Progress Notes (Unsigned)
 SABRA

## 2024-08-13 ENCOUNTER — Ambulatory Visit: Payer: Self-pay | Admitting: Neurology

## 2024-08-13 DIAGNOSIS — G4733 Obstructive sleep apnea (adult) (pediatric): Secondary | ICD-10-CM

## 2024-08-13 NOTE — Procedures (Signed)
 [facility]  HOME SLEEP TEST (SANSA) REPORT (Mail-Out Device):   STUDY DATE: 08/06/2024  DOB: 1946/10/07  MRN: [id number]  ORDERING CLINICIAN: True Mar, MD, PhD   REFERRING CLINICIAN: Onita Rush, MD   CLINICAL INFORMATION/HISTORY (obtained from visit note dated 06/01/2024): 78 year old male with an underlying medical history of reflux disease, osteoporosis, allergies, hypertension, hyperlipidemia, retinopathy, hypothyroidism, diverticulosis, dementia (followed by [facility] neurology), depression, anxiety, hearing loss, and morbid obesity with a BMI of over 45, who was previously diagnosed with obstructive sleep apnea and placed on PAP therapy.  He has been compliant with his CPAP of 10 cm without EPR with good tolerance of treatment and good apnea control.  His machine is older and he should be eligible for new equipment.  BMI (at the time of sleep clinic visit and/or test date): 46.5 kg/m  FINDINGS:   Study Protocol:    The SANSA single-point-of-skin-contact chest-worn sensor - an FDA cleared and DOT approved type 4 home sleep test device - measures eight physiological channels,  including blood oxygen saturation (measured via PPG [photoplethysmography]), EKG-derived heart rate, respiratory effort, chest movement (measured via accelerometer), snoring, body position, and actigraphy. The device is designed to be worn for up to 10 hours per study.   Sleep Summary:   Total Recording Time (hours, min): 7 hours, 55 min  Total Effective Sleep Time (hours, min):  2 hours, 48 min  Sleep Efficiency (%):    35%   Respiratory Indices:   Calculated sAHI (per hour):  51/hour         Oxygen Saturation Statistics:    Oxygen Saturation (%) Mean: 95.2%   Minimum oxygen saturation (%):                 61.5%   O2 Saturation Range (%): 61.5-100%   Time below or at 88% saturation: 5 min   Pulse Rate Statistics:   Pulse Mean (bpm):    65/min    Pulse Range  (53-111/min)   Snoring: Mild to moderate  IMPRESSION/DIAGNOSES:   OSA (obstructive sleep apnea), severe   RECOMMENDATIONS:   This home sleep test demonstrates severe obstructive sleep apnea with a total AHI of 51/hour and O2 nadir of 61.5% with time below or at 88% saturation of over 5 minutes for the study.  Study was limited due to poor sleep consolidation and low sleep efficiency.  Mild to moderate snoring was detected.  Ongoing treatment with positive airway pressure is highly recommended. The patient has been compliant with his CPAP of 10 cm with good tolerance and good apnea control and should be eligible for a new machine.  I will recommend staying with the same pressure settings, mask of choice, sized to fit.  Ongoing full compliance will be greatly encouraged.  A laboratory attended titration study can be considered in the future for optimization of treatment settings and to improve tolerance and compliance, if needed, down the road. Alternative treatment options are limited secondary to the severity of the patient's sleep disordered breathing, but may include surgical treatment with an implantable hypoglossal nerve stimulator (in carefully selected candidates, meeting criteria).  Concomitant weight loss is recommended (where clinically appropriate). Please note, that untreated obstructive sleep apnea may carry additional perioperative morbidity. Patients with significant obstructive sleep apnea should receive perioperative PAP therapy and the surgeons and particularly the anesthesiologist should be informed of the diagnosis and the severity of the sleep disordered breathing. The patient should be cautioned not to drive, work at  heights, or operate dangerous or heavy equipment when tired or sleepy. Review and reiteration of good sleep hygiene measures should be pursued with any patient. Other causes of the patient's symptoms, including circadian rhythm disturbances, an underlying mood disorder,  medication effect and/or an underlying medical problem cannot be ruled out based on this test. Clinical correlation is recommended.  The patient and his referring provider will be notified of the test results. The patient will be seen in follow up in sleep clinic at [facility].  I certify that I have reviewed the raw data recording prior to the issuance of this report in accordance with the standards of the American Academy of Sleep Medicine (AASM).    INTERPRETING PHYSICIAN:   True Mar, MD, PhD Medical Director, [facility]) Diplomat, ABPN (Neurology and Sleep)   [facility] [address] [place], [place] [address] [phone]

## 2024-08-16 NOTE — Telephone Encounter (Signed)
 Lvm 1st attemp by hf 08/16/24

## 2024-08-16 NOTE — Telephone Encounter (Signed)
-----   Message from True Mar sent at 08/13/2024  3:47 PM EDT ----- Home sleep test from 08/06/2024 confirmed severe obstructive sleep apnea.  The patient should be eligible for a new machine.  Please advise patient that I have entered an order for a new machine and  we will send the order to his DME company.  Please encourage ongoing full compliance and encouraged working on weight loss and close follow-up with PCP and his other providers.  We should schedule a  follow-up in the sleep clinic in 30 to 90 days after set up with his new equipment. ----- Message ----- From: Mar True, MD Sent: 08/13/2024   3:43 PM EDT To: True Mar, MD

## 2024-08-17 NOTE — Telephone Encounter (Signed)
 Pt returned called. Please call back when available.

## 2024-08-18 NOTE — Telephone Encounter (Signed)
 Called pt at [phone]. Relayed results per Dr. Obie note. They verbalized understanding. Scheduled initial CPAP f/u with SS,NP 11/02/24 at 12:45pm. Sent community message to [employer] that order placed.

## 2024-08-23 ENCOUNTER — Encounter: Payer: Self-pay | Admitting: Radiology

## 2024-09-01 ENCOUNTER — Encounter (HOSPITAL_COMMUNITY): Payer: Self-pay | Admitting: Internal Medicine

## 2024-09-01 NOTE — Progress Notes (Signed)
 Attempted to obtain medical history for pre op call via telephone, unable to reach at this time. HIPAA compliant voicemail message left requesting return call to pre surgical testing department.

## 2024-09-07 ENCOUNTER — Encounter (HOSPITAL_COMMUNITY): Payer: Self-pay | Admitting: Internal Medicine

## 2024-09-07 ENCOUNTER — Ambulatory Visit (HOSPITAL_COMMUNITY): Admitting: Certified Registered"

## 2024-09-07 ENCOUNTER — Ambulatory Visit (HOSPITAL_COMMUNITY)
Admission: RE | Admit: 2024-09-07 | Discharge: 2024-09-07 | Disposition: A | Discharge status: Home / Self Care | Attending: Internal Medicine | Admitting: Internal Medicine

## 2024-09-07 ENCOUNTER — Encounter (HOSPITAL_COMMUNITY)
Admission: RE | Disposition: A | Discharge status: Home / Self Care | Payer: Self-pay | Source: Home / Self Care | Attending: Internal Medicine

## 2024-09-07 ENCOUNTER — Other Ambulatory Visit: Payer: Self-pay

## 2024-09-07 DIAGNOSIS — I10 Essential (primary) hypertension: Secondary | ICD-10-CM | POA: Diagnosis not present

## 2024-09-07 DIAGNOSIS — K2970 Gastritis, unspecified, without bleeding: Secondary | ICD-10-CM | POA: Diagnosis not present

## 2024-09-07 DIAGNOSIS — K640 First degree hemorrhoids: Secondary | ICD-10-CM | POA: Diagnosis not present

## 2024-09-07 DIAGNOSIS — Z5971 Insufficient health insurance coverage: Secondary | ICD-10-CM | POA: Insufficient documentation

## 2024-09-07 DIAGNOSIS — K5521 Angiodysplasia of colon with hemorrhage: Secondary | ICD-10-CM | POA: Diagnosis not present

## 2024-09-07 DIAGNOSIS — D124 Benign neoplasm of descending colon: Secondary | ICD-10-CM | POA: Diagnosis not present

## 2024-09-07 DIAGNOSIS — D120 Benign neoplasm of cecum: Secondary | ICD-10-CM | POA: Diagnosis not present

## 2024-09-07 DIAGNOSIS — K5730 Diverticulosis of large intestine without perforation or abscess without bleeding: Secondary | ICD-10-CM | POA: Insufficient documentation

## 2024-09-07 DIAGNOSIS — R1013 Epigastric pain: Secondary | ICD-10-CM | POA: Diagnosis not present

## 2024-09-07 DIAGNOSIS — G4730 Sleep apnea, unspecified: Secondary | ICD-10-CM | POA: Insufficient documentation

## 2024-09-07 DIAGNOSIS — K317 Polyp of stomach and duodenum: Secondary | ICD-10-CM | POA: Diagnosis not present

## 2024-09-07 DIAGNOSIS — K2100 Gastro-esophageal reflux disease with esophagitis, without bleeding: Secondary | ICD-10-CM | POA: Insufficient documentation

## 2024-09-07 DIAGNOSIS — K3189 Other diseases of stomach and duodenum: Secondary | ICD-10-CM | POA: Insufficient documentation

## 2024-09-07 DIAGNOSIS — D122 Benign neoplasm of ascending colon: Secondary | ICD-10-CM | POA: Insufficient documentation

## 2024-09-07 DIAGNOSIS — D123 Benign neoplasm of transverse colon: Secondary | ICD-10-CM | POA: Insufficient documentation

## 2024-09-07 DIAGNOSIS — Z79899 Other long term (current) drug therapy: Secondary | ICD-10-CM | POA: Insufficient documentation

## 2024-09-07 DIAGNOSIS — R12 Heartburn: Secondary | ICD-10-CM | POA: Diagnosis present

## 2024-09-07 HISTORY — PX: ESOPHAGOGASTRODUODENOSCOPY: SHX5428

## 2024-09-07 HISTORY — PX: COLONOSCOPY: SHX5424

## 2024-09-07 SURGERY — COLONOSCOPY
Anesthesia: Monitor Anesthesia Care

## 2024-09-07 MED ORDER — PROPOFOL 10 MG/ML IV BOLUS
INTRAVENOUS | Status: DC | PRN
Start: 2024-09-07 — End: 2024-09-07
  Administered 2024-09-07 (×2): 30 mg via INTRAVENOUS

## 2024-09-07 MED ORDER — SODIUM CHLORIDE 0.9 % IV SOLN: INTRAVENOUS | Status: DC

## 2024-09-07 MED ORDER — PROPOFOL 500 MG/50ML IV EMUL
INTRAVENOUS | Status: DC | PRN
Start: 2024-09-07 — End: 2024-09-07
  Administered 2024-09-07: 120 ug/kg/min via INTRAVENOUS

## 2024-09-07 NOTE — Discharge Instructions (Signed)
 YOU HAD AN ENDOSCOPIC PROCEDURE TODAY: Refer to the procedure report and other information in the discharge instructions given to you for any specific questions about what was found during the examination. If this information does not answer your questions, please call the [facility] office at [phone] to clarify.   YOU SHOULD EXPECT: Some feelings of bloating in the abdomen. Passage of more gas than usual. Walking can help get rid of the air that was put into your GI tract during the procedure and reduce the bloating. If you had a lower endoscopy (such as a colonoscopy or flexible sigmoidoscopy) you may notice spotting of blood in your stool or on the toilet paper. Some abdominal soreness may be present for a day or two, also.  DIET: Your first meal following the procedure should be a light meal and then it is ok to progress to your normal diet. A half-sandwich or bowl of soup is an example of a good first meal. Heavy or fried foods are harder to digest and may make you feel nauseous or bloated. Drink plenty of fluids but you should avoid alcoholic beverages for 24 hours.  ACTIVITY: Your care partner should take you home directly after the procedure. You should plan to take it easy, moving slowly for the rest of the day. You can resume normal activity the day after the procedure however YOU SHOULD NOT DRIVE, use power tools, machinery or perform tasks that involve climbing or major physical exertion for 24 hours (because of the sedation medicines used during the test).   SYMPTOMS TO REPORT IMMEDIATELY: A gastroenterologist can be reached at any hour. Please call [phone]  for any of the following symptoms:  Following lower endoscopy (colonoscopy, flexible sigmoidoscopy) Excessive amounts of blood in the stool  Significant tenderness, worsening of abdominal pains  Swelling of the abdomen that is new, acute  Fever of 100 or higher  Following upper endoscopy (EGD, EUS, ERCP, esophageal  dilation) Vomiting of blood or coffee ground material  New, significant abdominal pain  New, significant chest pain or pain under the shoulder blades  Painful or persistently difficult swallowing  New shortness of breath  Black, tarry-looking or red, bloody stools  FOLLOW UP:  If any biopsies were taken you will be contacted by phone or by letter within the next 1-3 weeks. Call [phone]  if you have not heard about the biopsies in 3 weeks.  Please also call with any specific questions about appointments or follow up tests.

## 2024-09-07 NOTE — Op Note (Signed)
 [facility] Patient Name: Steve [name] Procedure Date: 09/07/2024 MRN: [id number] Attending MD: Estefana Keas DO, DO, [id number] Date of Birth: Aug 14, 1946 CSN: [id number] Age: 78 Admit Type: Outpatient Procedure:                Upper GI endoscopy Indications:              Epigastric abdominal pain, Heartburn Providers:                Estefana Keas DO, DO, Jacquelyn Jaci Pierce,                            RN, Coye Bade, Technician Referring MD:              Medicines:                See the Anesthesia note for documentation of the                            administered medications Complications:            No immediate complications. Estimated Blood Loss:     Estimated blood loss was minimal. Procedure:                Pre-Anesthesia Assessment:                           - ASA Grade Assessment: III - A patient with severe                            systemic disease.                           - The risks and benefits of the procedure and the                            sedation options and risks were discussed with the                            patient. All questions were answered and informed                            consent was obtained.                           After obtaining informed consent, the endoscope was                            passed under direct vision. Throughout the                            procedure, the patient's blood pressure, pulse, and                            oxygen saturations were monitored continuously. The                            [id number]-[id number] ([id number]) Olympus endoscope was  introduced                            through the mouth, and advanced to the second part                            of duodenum. The upper GI endoscopy was                            accomplished without difficulty. The patient                            tolerated the procedure well. Scope In: Scope Out: Findings:      LA Grade B (one or more  mucosal breaks greater than 5 mm, not extending       between the tops of two mucosal folds) esophagitis with no bleeding was       found at the gastroesophageal junction.      A few 4 mm sessile polyps with no bleeding and no stigmata of recent       bleeding were found in the gastric body. Biopsies were taken with a cold       forceps for histology.      Segmental mild inflammation characterized by erythema was found in the       gastric antrum. Biopsies were taken with a cold forceps for Helicobacter       pylori testing.      The examined duodenum was normal. Impression:               - LA Grade B reflux esophagitis with no bleeding.                           - A few gastric polyps. Biopsied.                           - Gastritis. Biopsied.                           - Normal examined duodenum. Moderate Sedation:      Monitored anesthesia care provided by anesthesia department. Recommendation:           - Await pathology results.                           - Continue present medications.                           - Perform a colonoscopy today. Procedure Code(s):        --- Professional ---                           [phone], Esophagogastroduodenoscopy, flexible,                            transoral; with biopsy, single or multiple Diagnosis Code(s):        --- Professional ---  K21.00, Gastro-esophageal reflux disease with                            esophagitis, without bleeding                           K31.7, Polyp of stomach and duodenum                           K29.70, Gastritis, unspecified, without bleeding                           R10.13, Epigastric pain                           R12, Heartburn CPT copyright 2022 American Medical Association. All rights reserved. The codes documented in this report are preliminary and upon coder review may  be revised to meet current compliance requirements. Dr Estefana Keas, DO Estefana Keas DO, DO 09/07/2024  10:00:57 AM Number of Addenda: 0

## 2024-09-07 NOTE — H&P (Signed)
 [facility] H[facility]P  Subjective: Steve [name] is a 78 y.o. male who presents for EGD and colonoscopy. GERD refractory to medical management. Personal history colon polyps, blood per rectum. Voquenza samples provided to patient at last office visit, helped with symptoms though not covered by insurance. No chest pain or shortness of breath. No dysphagia.  Objective: General: Awake and alert, non-toxic in appearance Cardio: Regular rate and rhythm  Pulm: Clear to auscultation, no conversational dyspnea  Assessment:  Refractory GERD Personal history colon polyps Blood per rectum  Plan:  -EGD and colonoscopy today, discussed benefits, risks of bleeding/infection/perforation/missed lesion/anesthesia, he verbalized understanding and elected to proceed -Further recommendations to follow pending procedures  Estefana Keas, DO [facility]

## 2024-09-07 NOTE — Anesthesia Preprocedure Evaluation (Addendum)
 Anesthesia Evaluation  Patient identified by MRN, date of birth, ID band Patient awake    Reviewed: Allergy & Precautions, NPO status , Patient's Chart, lab work & pertinent test results  Airway Mallampati: II  TM Distance: >3 FB Neck ROM: Full    Dental  (+) Missing   Pulmonary sleep apnea and Continuous Positive Airway Pressure Ventilation    Pulmonary exam normal        Cardiovascular hypertension, Pt. on home beta blockers and Pt. 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  Reproductive/Obstetrics                              Anesthesia Physical Anesthesia Plan  ASA: 3  Anesthesia Plan: MAC   Post-op Pain Management:    Induction:   PONV Risk Score and Plan: Propofol infusion and Treatment may vary due to age or medical condition  Airway Management Planned: Nasal Cannula  Additional Equipment:   Intra-op Plan:   Post-operative Plan:   Informed Consent: I have reviewed the patients History and Physical, chart, labs and discussed the procedure including the risks, benefits and alternatives for the proposed anesthesia with the patient or authorized representative who has indicated his/her understanding and acceptance.     Dental advisory given  Plan Discussed with: CRNA  Anesthesia Plan Comments:          Anesthesia Quick Evaluation

## 2024-09-07 NOTE — Transfer of Care (Signed)
 Immediate Anesthesia Transfer of Care Note  Patient: Steve [name]  Procedure(s) Performed: COLONOSCOPY EGD (ESOPHAGOGASTRODUODENOSCOPY)  Patient Location: Endoscopy Unit  Anesthesia Type:MAC  Level of Consciousness: drowsy and patient cooperative  Airway & Oxygen Therapy: Patient Spontanous Breathing  Post-op Assessment: Report given to RN and Post -op Vital signs reviewed and stable  Post vital signs: Reviewed and stable  Last Vitals:  Vitals Value Taken Time  BP    Temp    Pulse    Resp    SpO2      Last Pain:  Vitals:   09/07/24 0816  TempSrc: Temporal  PainSc: 0-No pain         Complications: No notable events documented.

## 2024-09-07 NOTE — Op Note (Signed)
 [facility] Patient Name: Steve [name] Procedure Date: 09/07/2024 MRN: [id number] Attending MD: Estefana Keas DO, DO, [id number] Date of Birth: June 01, 1946 CSN: [id number] Age: 78 Admit Type: Outpatient Procedure:                Colonoscopy Indications:              Rectal bleeding Providers:                Estefana Keas DO, DO, Jacquelyn Jaci Pierce,                            RN, Coye Bade, Technician Referring MD:              Medicines:                See the Anesthesia note for documentation of the                            administered medications Complications:            No immediate complications. Estimated Blood Loss:     Estimated blood loss was minimal. Procedure:                Pre-Anesthesia Assessment:                           - ASA Grade Assessment: III - A patient with severe                            systemic disease.                           - The risks and benefits of the procedure and the                            sedation options and risks were discussed with the                            patient. All questions were answered and informed                            consent was obtained.                           After obtaining informed consent, the colonoscope                            was passed under direct vision. Throughout the                            procedure, the patient's blood pressure, pulse, and                            oxygen saturations were monitored continuously. The                            [id number]-[id number] ([id number]) colonoscope was introduced  through the anus and advanced to the the terminal                            ileum, with identification of the appendiceal                            orifice and IC valve. The colonoscopy was performed                            without difficulty. The patient tolerated the                            procedure well. The quality of the bowel                             preparation was evaluated using the BBPS [facility]                            Bowel Preparation Scale) with scores of: Right                            Colon = 3 (entire mucosa seen well with no residual                            staining, small fragments of stool or opaque                            liquid), Transverse Colon = 3 (entire mucosa seen                            well with no residual staining, small fragments of                            stool or opaque liquid) and Left Colon = 2 (minor                            amount of residual staining, small fragments of                            stool and/or opaque liquid, but mucosa seen well).                            The total BBPS score equals 8. The quality of the                            bowel preparation was good. The terminal ileum,                            ileocecal valve, appendiceal orifice, and rectum                            were photographed. Scope In: 9:32:15 AM Scope Out: 9:54:00  AM Scope Withdrawal Time: 0 hours 16 minutes 37 seconds  Total Procedure Duration: 0 hours 21 minutes 45 seconds  Findings:      The digital rectal exam findings include internal hemorrhoids (Grade I).      Three sessile polyps were found in the descending colon, ascending colon       and cecum. The polyps were diminutive in size. These polyps were removed       with a cold biopsy forceps. Resection and retrieval were complete.      Two sessile polyps were found in the hepatic flexure. The polyps were 3       to 5 mm in size. These polyps were removed with a cold snare. Resection       and retrieval were complete.      A single medium-sized angiodysplastic lesion without bleeding was found       in the cecum.      Multiple medium-mouthed and small-mouthed diverticula were found in the       left colon.      The terminal ileum appeared normal. Impression:               - Internal hemorrhoids (Grade I)  found on digital                            rectal exam.                           - Three diminutive polyps in the descending colon,                            in the ascending colon and in the cecum, removed                            with a cold biopsy forceps. Resected and retrieved.                           - Two 3 to 5 mm polyps at the hepatic flexure,                            removed with a cold snare. Resected and retrieved.                           - A single non-bleeding colonic angiodysplastic                            lesion.                           - Diverticulosis in the left colon.                           - The examined portion of the ileum was normal. Moderate Sedation:      Monitored anesthesia care provided by anesthesia department. Recommendation:           - Discharge patient to home.                           -  High fiber diet.                           - Await pathology results.                           - Repeat colonoscopy date to be determined after                            pending pathology results are reviewed for                            surveillance based on pathology results.                           - Return to my office in 2 months.                           - Telephone my office if symptomatic PRN.                           - Telephone my office for pathology results in 3                            weeks. Procedure Code(s):        --- Professional ---                           [id number], Colonoscopy, flexible; with removal of                            tumor(s), polyp(s), or other lesion(s) by snare                            technique                           45380, 59, Colonoscopy, flexible; with biopsy,                            single or multiple Diagnosis Code(s):        --- Professional ---                           D12.4, Benign neoplasm of descending colon                           D12.2, Benign neoplasm of ascending colon                            D12.0, Benign neoplasm of cecum                           D12.3, Benign neoplasm of transverse colon (hepatic                            flexure or  splenic flexure)                           K64.0, First degree hemorrhoids                           K62.5, Hemorrhage of anus and rectum                           K57.30, Diverticulosis of large intestine without                            perforation or abscess without bleeding CPT copyright 2022 [employer]. All rights reserved. The codes documented in this report are preliminary and upon coder review may  be revised to meet current compliance requirements. Dr Estefana Keas, DO Estefana Keas DO, DO 09/07/2024 10:07:47 AM Number of Addenda: 0

## 2024-09-08 ENCOUNTER — Encounter (HOSPITAL_COMMUNITY): Payer: Self-pay | Admitting: Internal Medicine

## 2024-09-08 NOTE — Anesthesia Postprocedure Evaluation (Signed)
 Anesthesia Post Note  Patient: Bralyn Folkert  Procedure(s) Performed: COLONOSCOPY EGD (ESOPHAGOGASTRODUODENOSCOPY)     Patient location during evaluation: Endoscopy Anesthesia Type: MAC Level of consciousness: awake Pain management: pain level controlled Vital Signs Assessment: post-procedure vital signs reviewed and stable Respiratory status: spontaneous breathing, nonlabored ventilation and respiratory function stable Cardiovascular status: blood pressure returned to baseline and stable Postop Assessment: no apparent nausea or vomiting Anesthetic complications: no   No notable events documented.  Last Vitals:  Vitals:   09/07/24 1010 09/07/24 1020  BP: 133/81 130/81  Pulse: 67 72  Resp: 16 15  Temp:    SpO2: 97% 93%    Last Pain:  Vitals:   09/07/24 1020  TempSrc:   PainSc: 0-No pain                 Christerpher Clos P Jarreau Callanan

## 2024-09-09 LAB — SURGICAL PATHOLOGY

## 2024-11-01 ENCOUNTER — Other Ambulatory Visit: Payer: Self-pay

## 2024-11-01 DIAGNOSIS — I714 Abdominal aortic aneurysm, without rupture, unspecified: Secondary | ICD-10-CM | POA: Insufficient documentation

## 2024-11-01 DIAGNOSIS — I7102 Dissection of abdominal aorta: Secondary | ICD-10-CM

## 2024-11-02 ENCOUNTER — Ambulatory Visit: Admitting: Neurology

## 2024-11-09 ENCOUNTER — Encounter: Payer: Self-pay | Admitting: Neurology

## 2024-11-22 ENCOUNTER — Ambulatory Visit (HOSPITAL_COMMUNITY)

## 2024-11-23 ENCOUNTER — Telehealth: Admitting: Neurology

## 2024-11-23 DIAGNOSIS — G4733 Obstructive sleep apnea (adult) (pediatric): Secondary | ICD-10-CM

## 2024-11-23 NOTE — Patient Instructions (Signed)
 Great to see you today! Continue CPAP usage minimum 4 hours nightly Continue current settings Continue to replace supplies routinely through DME Follow-up in 1 year or sooner if needed. Thanks!!

## 2024-12-07 ENCOUNTER — Ambulatory Visit: Admitting: Vascular Surgery

## 2025-11-30 ENCOUNTER — Telehealth: Admitting: Neurology
